# Patient Record
Sex: Female | Born: 1935 | Race: White | Hispanic: No | Smoking: Never smoker
Health system: Southern US, Community
[De-identification: ages and names within clinical notes are randomized; demographics above are authoritative.]

## PROBLEM LIST (undated history)

## (undated) DIAGNOSIS — G43909 Migraine, unspecified, not intractable, without status migrainosus: Secondary | ICD-10-CM

## (undated) DIAGNOSIS — R609 Edema, unspecified: Secondary | ICD-10-CM

## (undated) DIAGNOSIS — E039 Hypothyroidism, unspecified: Secondary | ICD-10-CM

## (undated) DIAGNOSIS — I270 Primary pulmonary hypertension: Secondary | ICD-10-CM

## (undated) DIAGNOSIS — N390 Urinary tract infection, site not specified: Secondary | ICD-10-CM

## (undated) DIAGNOSIS — K5900 Constipation, unspecified: Secondary | ICD-10-CM

## (undated) DIAGNOSIS — R531 Weakness: Secondary | ICD-10-CM

## (undated) DIAGNOSIS — W19XXXA Unspecified fall, initial encounter: Secondary | ICD-10-CM

## (undated) DIAGNOSIS — R42 Dizziness and giddiness: Secondary | ICD-10-CM

## (undated) DIAGNOSIS — F32A Depression, unspecified: Secondary | ICD-10-CM

## (undated) DIAGNOSIS — K219 Gastro-esophageal reflux disease without esophagitis: Secondary | ICD-10-CM

## (undated) DIAGNOSIS — F329 Major depressive disorder, single episode, unspecified: Secondary | ICD-10-CM

## (undated) DIAGNOSIS — E785 Hyperlipidemia, unspecified: Secondary | ICD-10-CM

## (undated) DIAGNOSIS — J329 Chronic sinusitis, unspecified: Secondary | ICD-10-CM

## (undated) DIAGNOSIS — M25519 Pain in unspecified shoulder: Secondary | ICD-10-CM

## (undated) DIAGNOSIS — M1990 Unspecified osteoarthritis, unspecified site: Secondary | ICD-10-CM

## (undated) DIAGNOSIS — J45909 Unspecified asthma, uncomplicated: Secondary | ICD-10-CM

## (undated) DIAGNOSIS — I10 Essential (primary) hypertension: Secondary | ICD-10-CM

## (undated) HISTORY — PX: KNEE SURGERY: SHX244

## (undated) HISTORY — PX: CHOLECYSTECTOMY: SHX55

## (undated) HISTORY — PX: OTHER SURGICAL HISTORY: SHX169

## (undated) HISTORY — PX: ABDOMINAL HYSTERECTOMY: SHX81

---

## 2003-09-02 ENCOUNTER — Inpatient Hospital Stay (HOSPITAL_COMMUNITY): Admission: RE | Admit: 2003-09-02 | Discharge: 2003-09-03 | Payer: Self-pay | Admitting: Otolaryngology

## 2003-09-02 ENCOUNTER — Encounter (INDEPENDENT_AMBULATORY_CARE_PROVIDER_SITE_OTHER): Payer: Self-pay | Admitting: *Deleted

## 2008-10-13 ENCOUNTER — Emergency Department (HOSPITAL_BASED_OUTPATIENT_CLINIC_OR_DEPARTMENT_OTHER): Admission: EM | Admit: 2008-10-13 | Discharge: 2008-10-13 | Payer: Self-pay | Admitting: Emergency Medicine

## 2009-08-06 ENCOUNTER — Emergency Department (HOSPITAL_BASED_OUTPATIENT_CLINIC_OR_DEPARTMENT_OTHER)
Admission: EM | Admit: 2009-08-06 | Discharge: 2009-08-06 | Payer: Self-pay | Source: Home / Self Care | Admitting: Emergency Medicine

## 2010-06-24 NOTE — Op Note (Signed)
NAME:  Jade Rodriguez, Jade Rodriguez                          ACCOUNT NO.:  [id number]   MEDICAL RECORD NO.:  [id number]                   PATIENT TYPE:  INP   LOCATION:  2550                                 FACILITY:  [facility]   PHYSICIAN:  Suzanna Obey, M.D.                    DATE OF BIRTH:  01-Nov-1923   DATE OF PROCEDURE:  09/02/2003  DATE OF DISCHARGE:                                 OPERATIVE REPORT   PREOPERATIVE DIAGNOSIS:  Right tongue mass.   POSTOPERATIVE DIAGNOSIS:  Right tongue mass.   PROCEDURE:  Right hemiglossectomy and direct laryngoscopy.   ANESTHESIA:  General endotracheal tube.   ESTIMATED BLOOD LOSS:  Approximately 20 mL.   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  I do not have those records from another  state.  She now has a mass on the tongue again which was biopsied and showed  severe dysplasia.  The mass is exophytic and approximately 1 cm in size with  extension of irregular tissue beyond this point to about 2 cm.  The patient  was informed of the risks and benefits of the procedure including bleeding,  infection, scar, dysphonia, dysphagia, numbness of the tongue, recurrence,  and risk of anesthetic.  All questions were answered and consent was  obtained.   DESCRIPTION OF PROCEDURE:  The patient was taken to the operating room and  placed in the supine position.  After adequate general endotracheal tube  anesthesia was placed in the Rose position and the direct laryngoscopy scope  was used to examine the oral cavity, oropharynx, hypopharynx.  There were no  lesions identified in the piriform, vallecula, epiglottis, periglottis  looked normal.  The postcricoid cervical esophageal region looked normal.  The palate and tonsils all looked clear.  The tongue base all felt clear.  The lesion was still present on the right side and  exophytic.  The procedure  was then performed by draping in the usual sterile fashion.  An incision was  made with electrocautery around this lesion with a good margin by  visualization.  The tumor was resected with a fairly wide piece of tissue of  about 3 cm and 4 cm measurement.  The frozen section was sent.  The superior  margin had possible carcinoma in situ as the diagnosis, so another section  was removed from the whole superior margin from  anterior to posterior.  This was sent for permanent section.  The lining  still looked by visualization totally normal at that site.  The tongue was  closed after irrigation with interrupted 3-0 Vicryl.  The patient was then  awakened and brought to the recovery room in stable condition.  Needle,  sponge, and instrument counts correct.  Suzanna Obey, M.D.    Cordelia Pen  D:  09/02/2003  T:  09/02/2003  Job:  [id number]   cc:   Brooke Bonito  [address]  [place]  [place] [address]  Fax: [phone]

## 2011-11-16 ENCOUNTER — Emergency Department (HOSPITAL_BASED_OUTPATIENT_CLINIC_OR_DEPARTMENT_OTHER)
Admission: EM | Admit: 2011-11-16 | Discharge: 2011-11-16 | Disposition: A | Discharge status: Home / Self Care | Payer: Medicare Other | Attending: Emergency Medicine | Admitting: Emergency Medicine

## 2011-11-16 ENCOUNTER — Encounter (HOSPITAL_BASED_OUTPATIENT_CLINIC_OR_DEPARTMENT_OTHER): Payer: Self-pay | Admitting: *Deleted

## 2011-11-16 DIAGNOSIS — I10 Essential (primary) hypertension: Secondary | ICD-10-CM | POA: Insufficient documentation

## 2011-11-16 DIAGNOSIS — Z4802 Encounter for removal of sutures: Secondary | ICD-10-CM

## 2011-11-16 DIAGNOSIS — Z888 Allergy status to other drugs, medicaments and biological substances status: Secondary | ICD-10-CM | POA: Insufficient documentation

## 2011-11-16 HISTORY — DX: Essential (primary) hypertension: I10

## 2011-11-16 NOTE — ED Notes (Signed)
Here for suture removal from her face. She fell while in [employer] at an overlook and has an extensive facial laceration that is healing.

## 2011-11-16 NOTE — ED Notes (Signed)
Pt. Was seen by PA C and was discharged

## 2011-11-16 NOTE — ED Provider Notes (Signed)
History     CSN: [id number]  Arrival date & time 11/16/11  1447   First MD Initiated Contact with Patient 11/16/11 1634      Chief Complaint  Patient presents with  . Suture / Staple Removal    (Consider location/radiation/quality/duration/timing/severity/associated sxs/prior treatment) Patient is a 78 y.o. female presenting with suture removal. The history is provided by the patient. No language interpreter was used.  Suture / Staple Removal  The sutures were placed 3 to 6 days ago. Treatments since wound repair include antibiotic ointment use. There has been no drainage from the wound. The redness has worsened.   Pt has sutures in face and in right thumb.   Pt reports plastic surgeon told her to have sutures removed today.   Past Medical History  Diagnosis Date  . Hypertension     Past Surgical History  Procedure Date  . Abdominal hysterectomy   . Cholecystectomy   . Knee relacement     No family history on file.  History  Substance Use Topics  . Smoking status: Never Smoker   . Smokeless tobacco: Not on file  . Alcohol Use: No    OB History    Grav Para Term Preterm Abortions TAB SAB Ect Mult Living                  Review of Systems  Skin: Positive for wound.  All other systems reviewed and are negative.    Allergies  Aspirin and Maxidone  Home Medications   Current Outpatient Rx  Name Route Sig Dispense Refill  . AZOR PO Oral Take by mouth.    Marland Kitchen LEXAPRO PO Oral Take by mouth.    . ADVAIR DISKUS IN Inhalation Inhale into the lungs.    Marland Kitchen NORTRIPTYLINE HCL PO Oral Take by mouth.    Marland Kitchen RANITIDINE HCL PO Oral Take by mouth.    Marland Kitchen MAXALT PO Oral Take by mouth.    . TRIAMTERENE-HCTZ 37.5-25 MG PO TABS Oral Take 1 tablet by mouth daily.      BP 136/68  Pulse 66  Temp 97.3 F (36.3 C) (Oral)  Resp 20  SpO2 99%  Physical Exam  Nursing note and vitals reviewed. Constitutional: She is oriented to person, place, and time. She appears well-developed  and well-nourished.  HENT:  Head: Normocephalic.       Large healing laceration forehead,  3 smaller lacerations  Eyes: Conjunctivae normal are normal. Pupils are equal, round, and reactive to light.  Neck: Normal range of motion. Neck supple.  Musculoskeletal: Normal range of motion.  Neurological: She is alert and oriented to person, place, and time. She has normal reflexes.  Psychiatric: She has a normal mood and affect.    ED Course  Procedures (including critical care time)  Labs Reviewed - No data to display No results found.   No diagnosis found.    MDM  Sutures removed        Lonia Skinner [place], [place] 11/16/11 [id number]

## 2011-11-17 NOTE — ED Provider Notes (Signed)
Medical screening examination/treatment/procedure(s) were performed by non-physician practitioner and as supervising physician I was immediately available for consultation/collaboration.   Babita Amaker, MD 11/17/11 0009 

## 2014-08-17 ENCOUNTER — Encounter (HOSPITAL_BASED_OUTPATIENT_CLINIC_OR_DEPARTMENT_OTHER): Payer: Self-pay

## 2014-08-17 ENCOUNTER — Inpatient Hospital Stay (HOSPITAL_BASED_OUTPATIENT_CLINIC_OR_DEPARTMENT_OTHER)
Admission: EM | Admit: 2014-08-17 | Discharge: 2014-08-21 | DRG: 086 | Disposition: A | Discharge status: Skilled Nursing Facility | Payer: Medicare Other | Attending: Internal Medicine | Admitting: Internal Medicine

## 2014-08-17 ENCOUNTER — Emergency Department (HOSPITAL_BASED_OUTPATIENT_CLINIC_OR_DEPARTMENT_OTHER): Payer: Medicare Other

## 2014-08-17 ENCOUNTER — Inpatient Hospital Stay (HOSPITAL_COMMUNITY): Payer: Medicare Other

## 2014-08-17 DIAGNOSIS — K219 Gastro-esophageal reflux disease without esophagitis: Secondary | ICD-10-CM | POA: Diagnosis present

## 2014-08-17 DIAGNOSIS — R4182 Altered mental status, unspecified: Secondary | ICD-10-CM

## 2014-08-17 DIAGNOSIS — S065X0A Traumatic subdural hemorrhage without loss of consciousness, initial encounter: Principal | ICD-10-CM | POA: Diagnosis present

## 2014-08-17 DIAGNOSIS — R296 Repeated falls: Secondary | ICD-10-CM

## 2014-08-17 DIAGNOSIS — I270 Primary pulmonary hypertension: Secondary | ICD-10-CM | POA: Diagnosis not present

## 2014-08-17 DIAGNOSIS — R0902 Hypoxemia: Secondary | ICD-10-CM

## 2014-08-17 DIAGNOSIS — Z888 Allergy status to other drugs, medicaments and biological substances status: Secondary | ICD-10-CM

## 2014-08-17 DIAGNOSIS — M1990 Unspecified osteoarthritis, unspecified site: Secondary | ICD-10-CM | POA: Diagnosis present

## 2014-08-17 DIAGNOSIS — S065XAA Traumatic subdural hemorrhage with loss of consciousness status unknown, initial encounter: Secondary | ICD-10-CM | POA: Diagnosis present

## 2014-08-17 DIAGNOSIS — G43909 Migraine, unspecified, not intractable, without status migrainosus: Secondary | ICD-10-CM | POA: Diagnosis present

## 2014-08-17 DIAGNOSIS — I509 Heart failure, unspecified: Secondary | ICD-10-CM | POA: Diagnosis present

## 2014-08-17 DIAGNOSIS — G4700 Insomnia, unspecified: Secondary | ICD-10-CM | POA: Diagnosis present

## 2014-08-17 DIAGNOSIS — E039 Hypothyroidism, unspecified: Secondary | ICD-10-CM | POA: Diagnosis present

## 2014-08-17 DIAGNOSIS — Z9181 History of falling: Secondary | ICD-10-CM | POA: Diagnosis not present

## 2014-08-17 DIAGNOSIS — F329 Major depressive disorder, single episode, unspecified: Secondary | ICD-10-CM | POA: Diagnosis present

## 2014-08-17 DIAGNOSIS — R40241 Glasgow coma scale score 13-15: Secondary | ICD-10-CM | POA: Diagnosis not present

## 2014-08-17 DIAGNOSIS — E785 Hyperlipidemia, unspecified: Secondary | ICD-10-CM | POA: Diagnosis present

## 2014-08-17 DIAGNOSIS — Z79899 Other long term (current) drug therapy: Secondary | ICD-10-CM | POA: Diagnosis not present

## 2014-08-17 DIAGNOSIS — E7220 Disorder of urea cycle metabolism, unspecified: Secondary | ICD-10-CM

## 2014-08-17 DIAGNOSIS — I6200 Nontraumatic subdural hemorrhage, unspecified: Secondary | ICD-10-CM | POA: Diagnosis not present

## 2014-08-17 DIAGNOSIS — Z9049 Acquired absence of other specified parts of digestive tract: Secondary | ICD-10-CM | POA: Diagnosis present

## 2014-08-17 DIAGNOSIS — N179 Acute kidney failure, unspecified: Secondary | ICD-10-CM | POA: Diagnosis present

## 2014-08-17 DIAGNOSIS — S0101XA Laceration without foreign body of scalp, initial encounter: Secondary | ICD-10-CM | POA: Diagnosis present

## 2014-08-17 DIAGNOSIS — J45909 Unspecified asthma, uncomplicated: Secondary | ICD-10-CM | POA: Diagnosis present

## 2014-08-17 DIAGNOSIS — Z90710 Acquired absence of both cervix and uterus: Secondary | ICD-10-CM

## 2014-08-17 DIAGNOSIS — Z885 Allergy status to narcotic agent status: Secondary | ICD-10-CM

## 2014-08-17 DIAGNOSIS — I10 Essential (primary) hypertension: Secondary | ICD-10-CM | POA: Diagnosis present

## 2014-08-17 DIAGNOSIS — R739 Hyperglycemia, unspecified: Secondary | ICD-10-CM | POA: Diagnosis present

## 2014-08-17 DIAGNOSIS — Z96659 Presence of unspecified artificial knee joint: Secondary | ICD-10-CM | POA: Diagnosis present

## 2014-08-17 DIAGNOSIS — K760 Fatty (change of) liver, not elsewhere classified: Secondary | ICD-10-CM | POA: Diagnosis present

## 2014-08-17 DIAGNOSIS — J329 Chronic sinusitis, unspecified: Secondary | ICD-10-CM | POA: Diagnosis present

## 2014-08-17 DIAGNOSIS — R404 Transient alteration of awareness: Secondary | ICD-10-CM

## 2014-08-17 DIAGNOSIS — Z881 Allergy status to other antibiotic agents status: Secondary | ICD-10-CM

## 2014-08-17 DIAGNOSIS — W19XXXA Unspecified fall, initial encounter: Secondary | ICD-10-CM | POA: Diagnosis present

## 2014-08-17 DIAGNOSIS — S065X9A Traumatic subdural hemorrhage with loss of consciousness of unspecified duration, initial encounter: Secondary | ICD-10-CM | POA: Diagnosis present

## 2014-08-17 HISTORY — DX: Constipation, unspecified: K59.00

## 2014-08-17 HISTORY — DX: Edema, unspecified: R60.9

## 2014-08-17 HISTORY — DX: Depression, unspecified: F32.A

## 2014-08-17 HISTORY — DX: Major depressive disorder, single episode, unspecified: F32.9

## 2014-08-17 HISTORY — DX: Dizziness and giddiness: R42

## 2014-08-17 HISTORY — DX: Hypothyroidism, unspecified: E03.9

## 2014-08-17 HISTORY — DX: Pain in unspecified shoulder: M25.519

## 2014-08-17 HISTORY — DX: Migraine, unspecified, not intractable, without status migrainosus: G43.909

## 2014-08-17 HISTORY — DX: Chronic sinusitis, unspecified: J32.9

## 2014-08-17 HISTORY — DX: Unspecified fall, initial encounter: W19.XXXA

## 2014-08-17 HISTORY — DX: Hyperlipidemia, unspecified: E78.5

## 2014-08-17 HISTORY — DX: Weakness: R53.1

## 2014-08-17 HISTORY — DX: Primary pulmonary hypertension: I27.0

## 2014-08-17 HISTORY — DX: Unspecified asthma, uncomplicated: J45.909

## 2014-08-17 HISTORY — DX: Unspecified osteoarthritis, unspecified site: M19.90

## 2014-08-17 HISTORY — DX: Urinary tract infection, site not specified: N39.0

## 2014-08-17 HISTORY — DX: Gastro-esophageal reflux disease without esophagitis: K21.9

## 2014-08-17 LAB — BASIC METABOLIC PANEL
ANION GAP: 11 (ref 5–15)
BUN: 34 mg/dL — AB (ref 6–20)
CALCIUM: 9.4 mg/dL (ref 8.9–10.3)
CO2: 28 mmol/L (ref 22–32)
Chloride: 94 mmol/L — ABNORMAL LOW (ref 101–111)
Creatinine, Ser: 1.46 mg/dL — ABNORMAL HIGH (ref 0.44–1.00)
GFR calc Af Amer: 36 mL/min — ABNORMAL LOW (ref >60)
GFR calc non Af Amer: 31 mL/min — ABNORMAL LOW (ref >60)
Glucose, Bld: 146 mg/dL — ABNORMAL HIGH (ref 65–99)
Potassium: 4.4 mmol/L (ref 3.5–5.1)
SODIUM: 133 mmol/L — AB (ref 135–145)

## 2014-08-17 LAB — CBC WITH DIFFERENTIAL/PLATELET
BASOS ABS: 0.1 10*3/uL (ref 0.0–0.1)
BASOS ABS: 0 10*3/uL (ref 0.0–0.1)
BASOS PCT: 0 % (ref 0–1)
Basophils Relative: 1 % (ref 0–1)
EOS ABS: 0.1 10*3/uL (ref 0.0–0.7)
EOS ABS: 0.1 10*3/uL (ref 0.0–0.7)
Eosinophils Relative: 1 % (ref 0–5)
Eosinophils Relative: 1 % (ref 0–5)
HCT: 38.2 % (ref 36.0–46.0)
HCT: 36.9 % (ref 36.0–46.0)
Hemoglobin: 12.3 g/dL (ref 12.0–15.0)
Hemoglobin: 12 g/dL (ref 12.0–15.0)
LYMPHS ABS: 1.2 10*3/uL (ref 0.7–4.0)
LYMPHS PCT: 16 % (ref 12–46)
Lymphocytes Relative: 14 % (ref 12–46)
Lymphs Abs: 1.3 10*3/uL (ref 0.7–4.0)
MCH: 27.3 pg (ref 26.0–34.0)
MCH: 27.1 pg (ref 26.0–34.0)
MCHC: 32.2 g/dL (ref 30.0–36.0)
MCHC: 32.5 g/dL (ref 30.0–36.0)
MCV: 84.9 fL (ref 78.0–100.0)
MCV: 83.3 fL (ref 78.0–100.0)
MONO ABS: 0.9 10*3/uL (ref 0.1–1.0)
MONOS PCT: 9 % (ref 3–12)
MONOS PCT: 12 % (ref 3–12)
Monocytes Absolute: 0.9 10*3/uL (ref 0.1–1.0)
NEUTROS ABS: 5.5 10*3/uL (ref 1.7–7.7)
NEUTROS PCT: 75 % (ref 43–77)
Neutro Abs: 7 10*3/uL (ref 1.7–7.7)
Neutrophils Relative %: 71 % (ref 43–77)
Platelets: 223 10*3/uL (ref 150–400)
Platelets: 221 10*3/uL (ref 150–400)
RBC: 4.5 MIL/uL (ref 3.87–5.11)
RBC: 4.43 MIL/uL (ref 3.87–5.11)
RDW: 15.2 % (ref 11.5–15.5)
RDW: 15.2 % (ref 11.5–15.5)
WBC: 9.2 10*3/uL (ref 4.0–10.5)
WBC: 7.8 10*3/uL (ref 4.0–10.5)

## 2014-08-17 LAB — COMPREHENSIVE METABOLIC PANEL
ALT: 21 U/L (ref 14–54)
AST: 25 U/L (ref 15–41)
Albumin: 3.6 g/dL (ref 3.5–5.0)
Alkaline Phosphatase: 89 U/L (ref 38–126)
Anion gap: 8 (ref 5–15)
BILIRUBIN TOTAL: 0.3 mg/dL (ref 0.3–1.2)
BUN: 28 mg/dL — ABNORMAL HIGH (ref 6–20)
CO2: 29 mmol/L (ref 22–32)
Calcium: 9.2 mg/dL (ref 8.9–10.3)
Chloride: 95 mmol/L — ABNORMAL LOW (ref 101–111)
Creatinine, Ser: 1.24 mg/dL — ABNORMAL HIGH (ref 0.44–1.00)
GFR calc Af Amer: 44 mL/min — ABNORMAL LOW (ref >60)
GFR calc non Af Amer: 38 mL/min — ABNORMAL LOW (ref >60)
Glucose, Bld: 91 mg/dL (ref 65–99)
POTASSIUM: 4.5 mmol/L (ref 3.5–5.1)
SODIUM: 132 mmol/L — AB (ref 135–145)
Total Protein: 6.4 g/dL — ABNORMAL LOW (ref 6.5–8.1)

## 2014-08-17 LAB — PROTIME-INR
INR: 0.98 (ref 0.00–1.49)
INR: 1.08 (ref 0.00–1.49)
Prothrombin Time: 13.2 seconds (ref 11.6–15.2)
Prothrombin Time: 14.2 seconds (ref 11.6–15.2)

## 2014-08-17 LAB — MRSA PCR SCREENING: MRSA by PCR: NEGATIVE

## 2014-08-17 LAB — TSH: TSH: 2.716 u[IU]/mL (ref 0.350–4.500)

## 2014-08-17 LAB — MAGNESIUM: Magnesium: 1.8 mg/dL (ref 1.7–2.4)

## 2014-08-17 LAB — APTT: aPTT: 31 seconds (ref 24–37)

## 2014-08-17 LAB — PHOSPHORUS: Phosphorus: 3.6 mg/dL (ref 2.5–4.6)

## 2014-08-17 LAB — T4, FREE: Free T4: 0.96 ng/dL (ref 0.61–1.12)

## 2014-08-17 MED ORDER — AMLODIPINE BESYLATE 5 MG PO TABS
5.0000 mg | ORAL_TABLET | Freq: Every day | ORAL | Status: DC
  Administered 2014-08-17 – 2014-08-21 (×5): 5 mg via ORAL
  Filled 2014-08-17 (×5): qty 1

## 2014-08-17 MED ORDER — LIDOCAINE-EPINEPHRINE (PF) 2 %-1:200000 IJ SOLN
5.0000 mL | Freq: Once | INTRAMUSCULAR | Status: AC
  Administered 2014-08-17: 5 mL
  Filled 2014-08-17: qty 10

## 2014-08-17 MED ORDER — MOMETASONE FURO-FORMOTEROL FUM 100-5 MCG/ACT IN AERO
2.0000 | INHALATION_SPRAY | Freq: Two times a day (BID) | RESPIRATORY_TRACT | Status: DC
  Administered 2014-08-17 – 2014-08-20 (×5): 2 via RESPIRATORY_TRACT
  Filled 2014-08-17 (×2): qty 8.8

## 2014-08-17 MED ORDER — PANTOPRAZOLE SODIUM 40 MG PO TBEC
40.0000 mg | DELAYED_RELEASE_TABLET | Freq: Every day | ORAL | Status: DC
  Administered 2014-08-18: 40 mg via ORAL
  Filled 2014-08-17: qty 1

## 2014-08-17 MED ORDER — GABAPENTIN 100 MG PO CAPS
100.0000 mg | ORAL_CAPSULE | Freq: Two times a day (BID) | ORAL | Status: DC
  Administered 2014-08-17 – 2014-08-19 (×4): 100 mg via ORAL
  Filled 2014-08-17 (×7): qty 1

## 2014-08-17 MED ORDER — LEVOTHYROXINE SODIUM 25 MCG PO TABS
25.0000 ug | ORAL_TABLET | Freq: Every day | ORAL | Status: DC
  Administered 2014-08-18 – 2014-08-21 (×4): 25 ug via ORAL
  Filled 2014-08-17 (×5): qty 1

## 2014-08-17 MED ORDER — ACETAMINOPHEN 500 MG PO TABS
500.0000 mg | ORAL_TABLET | Freq: Four times a day (QID) | ORAL | Status: DC | PRN
  Administered 2014-08-17 – 2014-08-18 (×2): 500 mg via ORAL
  Filled 2014-08-17 (×2): qty 1

## 2014-08-17 MED ORDER — HYDRALAZINE HCL 20 MG/ML IJ SOLN: 10.0000 mg | INTRAMUSCULAR | Status: DC | PRN

## 2014-08-17 NOTE — ED Provider Notes (Signed)
CSN: [id number]     Arrival date & time 08/17/14  1322 History   First MD Initiated Contact with Patient 08/17/14 1343     Chief Complaint  Patient presents with  . Fall     (Consider location/radiation/quality/duration/timing/severity/associated sxs/prior Treatment) HPI The patient has a history of migraine headaches. She reports that she had a headache earlier in the day and had taken Maxalt. Subsequently she went to lunch and while trying to transfer from a chair to her scooter, she fell and struck the back of her head. She did get a laceration to the scalp. There was no loss of consciousness. The patient reports her headache is more mild now than it was when she had her migraine. She denies any cervical pain. There is no pain to the remainder of the body and no extremity dysfunction different than what she identifies as usual. She does not have a history of taking blood thinner medications. He has not developed nausea, vomiting or mental status change. Past Medical History  Diagnosis Date  . Hypertension   . Falls   . Weakness   . Asthma   . Depression   . Primary pulmonary HTN   . Hypothyroidism   . Edema   . Dizziness   . Dizziness and giddiness   . Migraine   . Sinusitis, chronic   . GERD (gastroesophageal reflux disease)   . Osteoarthritis   . Constipation   . Hyperlipemia   . UTI (lower urinary tract infection)   . Shoulder pain    Past Surgical History  Procedure Laterality Date  . Abdominal hysterectomy    . Cholecystectomy    . Knee relacement    . Knee surgery     No family history on file. History  Substance Use Topics  . Smoking status: Never Smoker   . Smokeless tobacco: Not on file  . Alcohol Use: No   OB History    No data available     Review of Systems 10 Systems reviewed and are negative for acute change except as noted in the HPI.    Allergies  Aspirin; Codeine; Erythromycin base; Hydrocodone; Macrobid; Maxidone; and  Nitrofurantoin  Home Medications   Prior to Admission medications   Medication Sig Start Date End Date Taking? Authorizing Provider  acetaminophen (TYLENOL ARTHRITIS PAIN) 650 MG CR tablet Take 650 mg by mouth every 8 (eight) hours as needed for pain.   Yes Historical Provider, MD  Calcium-Vitamin D-Vitamin K (VIACTIV PO) Take by mouth.   Yes Historical Provider, MD  Calcium-Vitamin D-Vitamin K 500-500-40 MG-UNT-MCG CHEW Chew by mouth.   Yes Historical Provider, MD  Docusate Sodium (COLACE PO) Take by mouth.   Yes Historical Provider, MD  furosemide (LASIX) 20 MG tablet Take 20 mg by mouth.   Yes Historical Provider, MD  GABAPENTIN PO Take by mouth.   Yes Historical Provider, MD  Levothyroxine Sodium (SYNTHROID PO) Take by mouth.   Yes Historical Provider, MD  Loratadine (CLARITIN PO) Take by mouth.   Yes Historical Provider, MD  MECLIZINE HCL PO Take by mouth.   Yes Historical Provider, MD  metaxalone (SKELAXIN) 800 MG tablet Take 800 mg by mouth 3 (three) times daily.   Yes Historical Provider, MD  mometasone (NASONEX) 50 MCG/ACT nasal spray Place 2 sprays into the nose daily.   Yes Historical Provider, MD  Multiple Vitamins-Minerals (CENTRUM SILVER PO) Take by mouth.   Yes Historical Provider, MD  NADOLOL PO Take by mouth.   Yes  Historical Provider, MD  OMEPRAZOLE PO Take by mouth.   Yes Historical Provider, MD  prochlorperazine (COMPAZINE) 5 MG tablet Take 5 mg by mouth every 6 (six) hours as needed for nausea or vomiting.   Yes Historical Provider, MD  traMADol (ULTRAM) 50 MG tablet Take by mouth every 6 (six) hours as needed.   Yes Historical Provider, MD  Amlodipine-Olmesartan (AZOR PO) Take by mouth.    Historical Provider, MD  Escitalopram Oxalate (LEXAPRO PO) Take by mouth.    Historical Provider, MD  Fluticasone-Salmeterol (ADVAIR DISKUS IN) Inhale into the lungs.    Historical Provider, MD  NORTRIPTYLINE HCL PO Take by mouth.    Historical Provider, MD  RANITIDINE HCL PO Take by  mouth.    Historical Provider, MD  Rizatriptan Benzoate (MAXALT PO) Take by mouth.    Historical Provider, MD  triamterene-hydrochlorothiazide (MAXZIDE-25) 37.5-25 MG per tablet Take 1 tablet by mouth daily.    Historical Provider, MD   BP 121/60 mmHg  Pulse 58  Temp(Src) 97.6 F (36.4 C) (Oral)  Resp 16  Ht 5\' 4"  (1.626 m)  Wt 150 lb (68.04 kg)  BMI 25.73 kg/m2  SpO2 98% Physical Exam  Constitutional: She is oriented to person, place, and time. She appears well-developed and well-nourished. No distress.  HENT:  Right Ear: External ear normal.  Left Ear: External ear normal.  Nose: Nose normal.  Mouth/Throat: Oropharynx is clear and moist.  2 cm through and through laceration to the posterior left scalp. No active bleeding at this time. No associated hematoma.  Eyes: EOM are normal. Pupils are equal, round, and reactive to light.  Neck: Neck supple.  No C-spine tenderness to palpation  Cardiovascular: Normal rate, regular rhythm, normal heart sounds and intact distal pulses.   Pulmonary/Chest: Effort normal and breath sounds normal.  Chest wall nontender to compression.  Abdominal: Soft. Bowel sounds are normal. She exhibits no distension. There is no tenderness.  Musculoskeletal: Normal range of motion. She exhibits no edema.  No extremity injuries or deformities. The patient is able to go through full range of motion without difficulty. She does have older bruising to the left lower leg from prior fall.  Neurological: She is alert and oriented to person, place, and time. She has normal strength. No cranial nerve deficit. She exhibits normal muscle tone. Coordination normal. GCS eye subscore is 4. GCS verbal subscore is 5. GCS motor subscore is 6.  Skin: Skin is warm, dry and intact.  Psychiatric: She has a normal mood and affect.    ED Course  Procedures (including critical care time) see procedure note which will be cosigned for Resident Dr. Alfonse Flavors. Labs Review Labs  Reviewed  CBC WITH DIFFERENTIAL/PLATELET  BASIC METABOLIC PANEL  PROTIME-INR    Imaging Review Ct Head Wo Contrast  08/17/2014   CLINICAL DATA:  Status post fall. Small laceration to the left occipital area.  EXAM: CT HEAD WITHOUT CONTRAST  TECHNIQUE: Contiguous axial images were obtained from the base of the skull through the vertex without intravenous contrast.  COMPARISON:  07/17/2014  FINDINGS: There is no evidence of mass effect or midline shift. There is no evidence of a space-occupying lesion. There is hyperdense fluid along the right cerebral convexity measuring 6 mm in thickness consistent with a subdural hemorrhage. There is 3 mm of right to left midline shift. There is no evidence of a cortical-based area of acute infarction. There is generalized cerebral atrophy. There is periventricular white matter low attenuation likely secondary  to microangiopathy.  The ventricles and sulci are appropriate for the patient's age. The basal cisterns are patent.  Visualized portions of the orbits are unremarkable. The visualized portions of the paranasal sinuses and mastoid air cells are unremarkable. Cerebrovascular atherosclerotic calcifications are noted.  The osseous structures are unremarkable.  IMPRESSION: 1. Acute right subdural hematoma measuring 6 mm with 3 mm of right-to-left midline shift. Critical Value/emergent results were called by telephone at the time of interpretation on 08/17/2014 at 3:03 pm to Dr. Arby Barrette , who verbally acknowledged these results.   Electronically Signed   By: Elige Ko   On: 08/17/2014 15:09     EKG Interpretation None     Consult: 1528 Dr. Lovell Sheehan advises for admission to ICU for observation and repeat CT head tomorrow. He was seen the patient in consult.  CRITICAL CARE Performed by: Arby Barrette   Total critical care time: 30  Critical care time was exclusive of separately billable procedures and treating other patients.  Critical care was  necessary to treat or prevent imminent or life-threatening deterioration.  Critical care was time spent personally by me on the following activities: development of treatment plan with patient and/or surrogate as well as nursing, discussions with consultants, evaluation of patient's response to treatment, examination of patient, obtaining history from patient or surrogate, ordering and performing treatments and interventions, ordering and review of laboratory studies, ordering and review of radiographic studies, pulse oximetry and re-evaluation of patient's condition. MDM   Final diagnoses:  Subdural hematoma   Patient presented as outlined with subdural hematoma from fall earlier today. At this time the patient does have clear mental status. She does not have restaurant stress. She will be admitted for consultation with Dr. neurosurgery for observation in the ICU. Laceration was repaired by resident assistant Dr. Devota Pace.    Arby Barrette, MD 08/17/14 1540

## 2014-08-17 NOTE — ED Notes (Signed)
Phone Handoff report given to [facility] Transport Team 

## 2014-08-17 NOTE — ED Notes (Signed)
Placed back on cont cardiac monitoring 

## 2014-08-17 NOTE — ED Notes (Signed)
Family at bedside. 

## 2014-08-17 NOTE — ED Notes (Addendum)
Family in room with pt

## 2014-08-17 NOTE — ED Notes (Signed)
Pt placed on cont cardiac monitoring.

## 2014-08-17 NOTE — ED Notes (Signed)
Pt c/o HA, understands will provide pain med after CT results have been posted

## 2014-08-17 NOTE — ED Notes (Signed)
Pt states fell today and hit head, family states pt fell this past Saturday also hitting head.

## 2014-08-17 NOTE — ED Notes (Signed)
Patient transported to CT, via stretcher, sr x 2 up, CT staff aware of pt high fall risk

## 2014-08-17 NOTE — ED Notes (Signed)
Small laceration, no active bleeding noted at this time at Left occipital area.

## 2014-08-17 NOTE — Consult Note (Signed)
Reason for Consult: Small right acute subdural hematoma Referring Physician: Dr. Fanny Bien is an 81 y.o. female.  HPI: The patient is an 81 year old white female who fell today striking her head. She suffered a small laceration of her scalp. She was taken to the ER where a CAT scan demonstrated a small right subdural hematoma. Her scalp was sutured. She was transferred to [facility] for further observation.  Presently the patient is alert and pleasant. She admits to a mild headache. She denies neck pain, numbness, tingling, seizures, etc. She does not take any blood thinners.  Past Medical History  Diagnosis Date  . Hypertension   . Falls   . Weakness   . Asthma   . Depression   . Primary pulmonary HTN   . Hypothyroidism   . Edema   . Dizziness   . Dizziness and giddiness   . Migraine   . Sinusitis, chronic   . GERD (gastroesophageal reflux disease)   . Osteoarthritis   . Constipation   . Hyperlipemia   . UTI (lower urinary tract infection)   . Shoulder pain     Past Surgical History  Procedure Laterality Date  . Abdominal hysterectomy    . Cholecystectomy    . Knee relacement    . Knee surgery      No family history on file.  Social History:  reports that she has never smoked. She does not have any smokeless tobacco history on file. She reports that she does not drink alcohol or use illicit drugs.  Allergies:  Allergies  Allergen Reactions  . Aspirin Other (See Comments)    Hx of bleeding ulcer  . Codeine Rash  . Erythromycin Base Rash  . Hydrocodone Rash  . Macrobid [Nitrofurantoin Monohyd Macro] Rash  . Maxidone [Hydrocodone-Acetaminophen] Rash    rash  . Nitrofurantoin Rash    Medications:  I have reviewed the patient's current medications. Prior to Admission:  Prescriptions prior to admission  Medication Sig Dispense Refill Last Dose  . acetaminophen (TYLENOL ARTHRITIS PAIN) 650 MG CR tablet Take 1,300 mg by mouth See admin  instructions. Take 2 caplets (1300 mg) every morning, may also take  2 caplets (1300 mg) every 8 hours as needed for pain   08/17/2014 at Unknown time  . acetaminophen (TYLENOL) 500 MG tablet Take 500 mg by mouth every 12 (twelve) hours as needed (pain).   08/15/2014  . amLODipine-olmesartan (AZOR) 5-40 MG per tablet Take 1 tablet by mouth at bedtime.   08/16/2014 at Unknown time  . Calcium-Vitamin D-Vitamin K (VIACTIV) 341-937-90 MG-UNT-MCG CHEW Chew 1 tablet by mouth daily.   08/17/2014 at Unknown time  . Cranberry-Vitamin C-Vitamin E (CRANBERRY PLUS VITAMIN C) 4200-20-3 MG-MG-UNIT CAPS Take 1 capsule by mouth daily.   08/17/2014 at Unknown time  . docusate sodium (COLACE) 100 MG capsule Take 100 mg by mouth daily as needed for mild constipation.   >week ago  . escitalopram (LEXAPRO) 5 MG tablet Take 5 mg by mouth daily.    08/17/2014 at Unknown time  . fluconazole (DIFLUCAN) 100 MG tablet Take 100 mg by mouth daily. 13 day course started 08/06/14   08/17/2014 at Unknown time  . Fluticasone-Salmeterol (ADVAIR) 250-50 MCG/DOSE AEPB Inhale 1 puff into the lungs 2 (two) times daily.   08/17/2014 at am  . furosemide (LASIX) 20 MG tablet Take 20 mg by mouth every Monday, Wednesday, and Friday.    08/17/2014  . gabapentin (NEURONTIN) 300 MG capsule  Take 900 mg by mouth at bedtime.    08/16/2014 at Unknown time  . levothyroxine (SYNTHROID, LEVOTHROID) 25 MCG tablet Take 25 mcg by mouth daily before breakfast. 6:45am   08/17/2014 at Unknown time  . loratadine (CLARITIN) 10 MG tablet Take 10 mg by mouth daily as needed for allergies.   >week ago  . meclizine (ANTIVERT) 25 MG tablet Take by mouth 2 (two) times daily as needed for dizziness (giddiness).    >week ago  . metaxalone (SKELAXIN) 800 MG tablet Take 400-800 mg by mouth See admin instructions. Take 1/2 - 1 tablet (400-800 mg) at onset of headache, may repeat in 6-8 hours if needed   08/17/2014 at Unknown time  . mometasone (NASONEX) 50 MCG/ACT nasal spray Place 2  sprays into both nostrils daily.    08/17/2014 at Unknown time  . Multiple Vitamin (MULTIVITAMIN WITH MINERALS) TABS tablet Take 1 tablet by mouth daily. Centrum Silver Ultra   08/17/2014 at Unknown time  . nadolol (CORGARD) 20 MG tablet Take 20 mg by mouth daily.    08/17/2014 at 848  . nystatin (MYCOSTATIN) powder Apply topically 4 (four) times daily.    08/16/2014  . omeprazole (PRILOSEC) 40 MG capsule Take 40 mg by mouth daily before lunch.    08/17/2014 at Unknown time  . prochlorperazine (COMPAZINE) 5 MG tablet Take 5 mg by mouth every 6 (six) hours as needed for nausea (headache).    >week ago  . ranitidine (ZANTAC) 300 MG tablet Take 300 mg by mouth at bedtime.    08/16/2014 at Unknown time  . rizatriptan (MAXALT) 10 MG tablet Take 10 mg by mouth daily as needed for migraine. May repeat in 2 hours if needed, max 3 tablets in 24 hours   08/17/2014 at Unknown time  . traMADol (ULTRAM) 50 MG tablet Take 50 mg by mouth See admin instructions. Take 1 tablet (50 mg) daily; may take an additional tablet 12 hours later as needed for pain   08/17/2014 at am  . triamterene-hydrochlorothiazide (MAXZIDE-25) 37.5-25 MG per tablet Take 1 tablet by mouth daily.   08/17/2014 at Unknown time   Scheduled: . amLODipine  5 mg Oral Daily  . gabapentin  100 mg Oral BID  . [START ON 08/18/2014] levothyroxine  25 mcg Oral QAC breakfast  . mometasone-formoterol  2 puff Inhalation BID  . [START ON 08/18/2014] pantoprazole  40 mg Oral Q1200   Continuous:  [id number], hydrALAZINE Anti-infectives    None       Results for orders placed or performed during the hospital encounter of 08/17/14 (from the past 48 hour(s))  Basic metabolic panel     Status: Abnormal   Collection Time: 08/17/14  3:08 PM  Result Value Ref Range   Sodium 133 (L) 135 - 145 mmol/L   Potassium 4.4 3.5 - 5.1 mmol/L   Chloride 94 (L) 101 - 111 mmol/L   CO2 28 22 - 32 mmol/L   Glucose, Bld 146 (H) 65 - 99 mg/dL   BUN 34 (H) 6 - 20 mg/dL    Creatinine, Ser 1.46 (H) 0.44 - 1.00 mg/dL   Calcium 9.4 8.9 - 10.3 mg/dL   GFR calc non Af Amer 31 (L) >60 mL/min   GFR calc Af Amer 36 (L) >60 mL/min    Comment: (NOTE) The eGFR has been calculated using the CKD EPI equation. This calculation has not been validated in all clinical situations. eGFR's persistently <60 mL/min signify possible Chronic Kidney Disease.  Anion gap 11 5 - 15  CBC with Differential     Status: None   Collection Time: 08/17/14  3:08 PM  Result Value Ref Range   WBC 9.2 4.0 - 10.5 K/uL   RBC 4.50 3.87 - 5.11 MIL/uL   Hemoglobin 12.3 12.0 - 15.0 g/dL   HCT 38.2 36.0 - 46.0 %   MCV 84.9 78.0 - 100.0 fL   MCH 27.3 26.0 - 34.0 pg   MCHC 32.2 30.0 - 36.0 g/dL   RDW 15.2 11.5 - 15.5 %   Platelets 223 150 - 400 K/uL   Neutrophils Relative % 75 43 - 77 %   Neutro Abs 7.0 1.7 - 7.7 K/uL   Lymphocytes Relative 14 12 - 46 %   Lymphs Abs 1.3 0.7 - 4.0 K/uL   Monocytes Relative 9 3 - 12 %   Monocytes Absolute 0.9 0.1 - 1.0 K/uL   Eosinophils Relative 1 0 - 5 %   Eosinophils Absolute 0.1 0.0 - 0.7 K/uL   Basophils Relative 1 0 - 1 %   Basophils Absolute 0.1 0.0 - 0.1 K/uL  Protime-INR     Status: None   Collection Time: 08/17/14  3:08 PM  Result Value Ref Range   Prothrombin Time 13.2 11.6 - 15.2 seconds   INR 0.98 0.00 - 1.49  CBC with Differential/Platelet     Status: None   Collection Time: 08/17/14  6:25 PM  Result Value Ref Range   WBC 7.8 4.0 - 10.5 K/uL   RBC 4.43 3.87 - 5.11 MIL/uL   Hemoglobin 12.0 12.0 - 15.0 g/dL   HCT 36.9 36.0 - 46.0 %   MCV 83.3 78.0 - 100.0 fL   MCH 27.1 26.0 - 34.0 pg   MCHC 32.5 30.0 - 36.0 g/dL   RDW 15.2 11.5 - 15.5 %   Platelets 221 150 - 400 K/uL   Neutrophils Relative % 71 43 - 77 %   Neutro Abs 5.5 1.7 - 7.7 K/uL   Lymphocytes Relative 16 12 - 46 %   Lymphs Abs 1.2 0.7 - 4.0 K/uL   Monocytes Relative 12 3 - 12 %   Monocytes Absolute 0.9 0.1 - 1.0 K/uL   Eosinophils Relative 1 0 - 5 %   Eosinophils  Absolute 0.1 0.0 - 0.7 K/uL   Basophils Relative 0 0 - 1 %   Basophils Absolute 0.0 0.0 - 0.1 K/uL  APTT     Status: None   Collection Time: 08/17/14  6:25 PM  Result Value Ref Range   aPTT 31 24 - 37 seconds  Comprehensive metabolic panel     Status: Abnormal   Collection Time: 08/17/14  6:25 PM  Result Value Ref Range   Sodium 132 (L) 135 - 145 mmol/L   Potassium 4.5 3.5 - 5.1 mmol/L   Chloride 95 (L) 101 - 111 mmol/L   CO2 29 22 - 32 mmol/L   Glucose, Bld 91 65 - 99 mg/dL   BUN 28 (H) 6 - 20 mg/dL   Creatinine, Ser 1.24 (H) 0.44 - 1.00 mg/dL   Calcium 9.2 8.9 - 10.3 mg/dL   Total Protein 6.4 (L) 6.5 - 8.1 g/dL   Albumin 3.6 3.5 - 5.0 g/dL   AST 25 15 - 41 U/L   ALT 21 14 - 54 U/L   Alkaline Phosphatase 89 38 - 126 U/L   Total Bilirubin 0.3 0.3 - 1.2 mg/dL   GFR calc non Af Amer 38 (L) >60  mL/min   GFR calc Af Amer 44 (L) >60 mL/min    Comment: (NOTE) The eGFR has been calculated using the CKD EPI equation. This calculation has not been validated in all clinical situations. eGFR's persistently <60 mL/min signify possible Chronic Kidney Disease.    Anion gap 8 5 - 15  Protime-INR     Status: None   Collection Time: 08/17/14  6:25 PM  Result Value Ref Range   Prothrombin Time 14.2 11.6 - 15.2 seconds   INR 1.08 0.00 - 1.49  Magnesium     Status: None   Collection Time: 08/17/14  6:25 PM  Result Value Ref Range   Magnesium 1.8 1.7 - 2.4 mg/dL  Phosphorus     Status: None   Collection Time: 08/17/14  6:25 PM  Result Value Ref Range   Phosphorus 3.6 2.5 - 4.6 mg/dL  TSH     Status: None   Collection Time: 08/17/14  6:25 PM  Result Value Ref Range   TSH 2.716 0.350 - 4.500 uIU/mL  T4, free     Status: None   Collection Time: 08/17/14  6:25 PM  Result Value Ref Range   Free T4 0.96 0.61 - 1.12 ng/dL    Ct Head Wo Contrast  08/17/2014   CLINICAL DATA:  Status post fall. Small laceration to the left occipital area.  EXAM: CT HEAD WITHOUT CONTRAST  TECHNIQUE:  Contiguous axial images were obtained from the base of the skull through the vertex without intravenous contrast.  COMPARISON:  07/17/2014  FINDINGS: There is no evidence of mass effect or midline shift. There is no evidence of a space-occupying lesion. There is hyperdense fluid along the right cerebral convexity measuring 6 mm in thickness consistent with a subdural hemorrhage. There is 3 mm of right to left midline shift. There is no evidence of a cortical-based area of acute infarction. There is generalized cerebral atrophy. There is periventricular white matter low attenuation likely secondary to microangiopathy.  The ventricles and sulci are appropriate for the patient's age. The basal cisterns are patent.  Visualized portions of the orbits are unremarkable. The visualized portions of the paranasal sinuses and mastoid air cells are unremarkable. Cerebrovascular atherosclerotic calcifications are noted.  The osseous structures are unremarkable.  IMPRESSION: 1. Acute right subdural hematoma measuring 6 mm with 3 mm of right-to-left midline shift. Critical Value/emergent results were called by telephone at the time of interpretation on 08/17/2014 at 3:03 pm to Dr. Charlesetta Shanks , who verbally acknowledged these results.   Electronically Signed   By: Kathreen Devoid   On: 08/17/2014 15:09   Dg Chest Port 1 View  08/17/2014   CLINICAL DATA:  81 year old female with hypoxemia. Difficulty catching her breath.  EXAM: PORTABLE CHEST - 1 VIEW  COMPARISON:  Chest such May 7 08/2014.  FINDINGS: Lung volumes are normal. No consolidative airspace disease. No pleural effusions. Mild diffuse peribronchial cuffing, most notable in the left mid lung. No evidence of pulmonary edema. Heart size is borderline enlarged. The patient is rotated to the left on today's exam, resulting in distortion of the mediastinal contours and reduced diagnostic sensitivity and specificity for mediastinal pathology. Atherosclerosis and tortuosity of  the thoracic aorta.  IMPRESSION: 1. Mild diffuse peribronchial cuffing may suggest bronchitis. 2. Atherosclerosis.   Electronically Signed   By: Vinnie Langton M.D.   On: 08/17/2014 18:24    ROS : as above Blood pressure 130/72, pulse 57, temperature 97.6 F (36.4 C), temperature source Oral, resp.  rate 17, height 5' 4"  (1.626 m), weight 68.04 kg (150 lb), SpO2 97 %. Physical Exam   General: an alert and pleasant 81 year old white female who appears well. She is in no apparent distress.  HEENT: Normocephalic. She has a small laceration in her left parietal scalp which has been sutured. Her pupils are equal round reactive light. She has bilateral lens implants. Extraocular muscles are intact.Nnn  Neck supple, without masses, deformities, tracheal deviation. She has a normal range of motion for her age.  Thorax: Symmetric  Abdomen: Soft  Extremities: Unremarkable  Back exam: Unremarkable  Neurologic exam: The patient is alert and oriented 3. Glasgow Coma Scale 15. Cranial nerves II through XII were examined bilaterally and grossly normal. Vision and hearing are grossly normal bilaterally. Motor strength is 5 over 5 in her bowel biceps, triceps, hand grip, quadriceps, gastrocnemius, dorsiflexors. Cerebellar exam is intact to rapid alternating movements of the upper extremities bilaterally. Sensory function is intact to light touch sensation all tested dermatomes bilaterally.  Imaging studies: I have reviewed the patient's head CT performed today. It demonstrates a small right acute subdural hematoma without significant mass effect.    Assessment/Plan: Small right acute subdural hematoma: The patient is presently neurologically normal. She is not taking any anticoagulatants. I would suggest repeating her CAT scan tomorrow. If it is unchanged then she can be discharged and follow-up with me in the office. I have answered all the patient's questions.  Gari Trovato D 08/17/2014, 8:13 PM

## 2014-08-17 NOTE — ED Notes (Signed)
High Fall Risk measures remain in place

## 2014-08-17 NOTE — ED Notes (Signed)
Pt is high fall risk, arm band on, sr x 2 up, callbell within reach, bed in lowest position, family at side

## 2014-08-17 NOTE — Procedures (Signed)
LACERATION REPAIR Performed by: Devota Pacealeb Brenlyn Beshara Authorized by: Arby BarretteMarcy Pfeiffer, MD Consent: Verbal consent obtained. Risks and benefits: risks, benefits and alternatives were discussed Consent given by: patient Patient identity confirmed: provided demographic data Prepped and Draped in normal sterile fashion Wound explored  Laceration Location: Scalp  Laceration Length: 3 cm  No Foreign Bodies seen or palpated  Anesthesia: local infiltration  Local anesthetic: lidocaine 2% with epinephrine  Anesthetic total: 5 ml  Irrigation method: syringe Amount of cleaning: standard  Skin closure: Primary Intention.   Number of sutures: 4  Technique: Simple Interrupted.   Patient tolerance: Patient tolerated the procedure well with no immediate complications.

## 2014-08-17 NOTE — H&P (Signed)
PULMONARY / CRITICAL CARE MEDICINE   Name: Jade Rodriguez MRN: [id number] DOB: 1923-10-08    ADMISSION DATE:  08/17/2014 CONSULTATION DATE:  08/17/2014  REFERRING MD :  Dr. Lovell Sheehan  CHIEF COMPLAINT:  SDH  INITIAL PRESENTATION: 81 year old female who resides at [facility] SNF fell while transferring to powerchair. Struck head with now LOC. CT scan shows subdural hematoma. Transfer to [facility] for neurosurgical evaluation.  STUDIES:  CT head 7/11 > Acute right subdural hematoma measuring 6 mm with 3 mm of right-to-left midline shift.  SIGNIFICANT EVENTS:   HISTORY OF PRESENT ILLNESS:  81 year old female with PMH as below, which includes HTN, asthma, primary pulm HTN, hypothyroid, migraines, GERD. She resides at [facility] where she was in her USOH until last night 7/10 when she developed L sided headache, which was consistent with her normal migraines. She denied visual disturbances and photophobia. This headache persisted throughout today 7/11 until lunch time. After eating she was transferring from chair to her power-chair and fell. She denies feelings of dizziness during that time. After the fall she was noted to have laceration to back of head with worsened headache. She presented to [facility] ED with this complaint head CT as above with SDH with midline shift. No loss of consciousness noted. Transferred to [facility] for icu admission and neurosurgical evaluation.  PAST MEDICAL HISTORY :   has a past medical history of Hypertension; Falls; Weakness; Asthma; Depression; Primary pulmonary HTN; Hypothyroidism; Edema; Dizziness; Dizziness and giddiness; Migraine; Sinusitis, chronic; GERD (gastroesophageal reflux disease); Osteoarthritis; Constipation; Hyperlipemia; UTI (lower urinary tract infection); and Shoulder pain.  has past surgical history that includes Abdominal hysterectomy; Cholecystectomy; knee relacement; and Knee surgery. Prior to Admission medications   Medication Sig Start Date End Date  Taking? Authorizing Provider  acetaminophen (TYLENOL ARTHRITIS PAIN) 650 MG CR tablet Take 650 mg by mouth every 8 (eight) hours as needed for pain.   Yes Historical Provider, MD  Calcium-Vitamin D-Vitamin K (VIACTIV PO) Take by mouth.   Yes Historical Provider, MD  Calcium-Vitamin D-Vitamin K 500-500-40 MG-UNT-MCG CHEW Chew by mouth.   Yes Historical Provider, MD  Docusate Sodium (COLACE PO) Take by mouth.   Yes Historical Provider, MD  furosemide (LASIX) 20 MG tablet Take 20 mg by mouth.   Yes Historical Provider, MD  GABAPENTIN PO Take by mouth.   Yes Historical Provider, MD  Levothyroxine Sodium (SYNTHROID PO) Take by mouth.   Yes Historical Provider, MD  Loratadine (CLARITIN PO) Take by mouth.   Yes Historical Provider, MD  MECLIZINE HCL PO Take by mouth.   Yes Historical Provider, MD  metaxalone (SKELAXIN) 800 MG tablet Take 800 mg by mouth 3 (three) times daily.   Yes Historical Provider, MD  mometasone (NASONEX) 50 MCG/ACT nasal spray Place 2 sprays into the nose daily.   Yes Historical Provider, MD  Multiple Vitamins-Minerals (CENTRUM SILVER PO) Take by mouth.   Yes Historical Provider, MD  NADOLOL PO Take by mouth.   Yes Historical Provider, MD  OMEPRAZOLE PO Take by mouth.   Yes Historical Provider, MD  prochlorperazine (COMPAZINE) 5 MG tablet Take 5 mg by mouth every 6 (six) hours as needed for nausea or vomiting.   Yes Historical Provider, MD  traMADol (ULTRAM) 50 MG tablet Take by mouth every 6 (six) hours as needed.   Yes Historical Provider, MD  Amlodipine-Olmesartan (AZOR PO) Take by mouth.    Historical Provider, MD  Escitalopram Oxalate (LEXAPRO PO) Take by mouth.  Historical Provider, MD  Fluticasone-Salmeterol (ADVAIR DISKUS IN) Inhale into the lungs.    Historical Provider, MD  NORTRIPTYLINE HCL PO Take by mouth.    Historical Provider, MD  RANITIDINE HCL PO Take by mouth.    Historical Provider, MD  Rizatriptan Benzoate (MAXALT PO) Take by mouth.    Historical Provider,  MD  triamterene-hydrochlorothiazide (MAXZIDE-25) 37.5-25 MG per tablet Take 1 tablet by mouth daily.    Historical Provider, MD   Allergies  Allergen Reactions  . Aspirin     Hx of bleeding ulcer  . Codeine   . Erythromycin Base   . Hydrocodone   . Macrobid [employer] Macro]   . Maxidone [Hydrocodone-Acetaminophen]     rash  . Nitrofurantoin     FAMILY HISTORY:  has no family status information on file.  SOCIAL HISTORY:  reports that she has never smoked. She does not have any smokeless tobacco history on file. She reports that she does not drink alcohol or use illicit drugs.  REVIEW OF SYSTEMS:   Bolds are positive  Constitutional: weight loss, gain, night sweats, Fevers, chills, fatigue .  HEENT: headaches, Sore throat, sneezing, nasal congestion, post nasal drip, Difficulty swallowing, Tooth/dental problems, visual complaints visual changes, ear ache CV:  chest pain, radiates: ,Orthopnea, PND, swelling in lower extremities, dizziness, palpitations, syncope.  GI  heartburn, indigestion, abdominal pain, nausea, vomiting, diarrhea, change in bowel habits, loss of appetite, bloody stools.  Resp: cough, productive: , hemoptysis, dyspnea, chest pain, pleuritic.  Skin: rash or itching or icterus GU: dysuria, change in color of urine, urgency or frequency. flank pain, hematuria  MS: joint pain or swelling. decreased range of motion  Psych: change in mood or affect. depression or anxiety.  Neuro: difficulty with speech, weakness, numbness, ataxia    SUBJECTIVE:   VITAL SIGNS: Temp:  [97.6 F (36.4 C)] 97.6 F (36.4 C) (07/11 1330) Pulse Rate:  [56-58] 56 (07/11 1557) Resp:  [14-18] 14 (07/11 1557) BP: (102-121)/(51-60) 121/60 mmHg (07/11 1557) SpO2:  [96 %-99 %] 99 % (07/11 1557) Weight:  [68.04 kg (150 lb)] 68.04 kg (150 lb) (07/11 1330) HEMODYNAMICS:   VENTILATOR SETTINGS:   INTAKE / OUTPUT: No intake or output data in the 24 hours ending 08/17/14  1729  PHYSICAL EXAMINATION: General:  Elderly female in NAD Neuro:  AAOx3, non-focal, some confusion HEENT:  Lac to occiput, PERRL, no JVD Cardiovascular:  RRR, no MRG Lungs:  Clear bilateral breath sounds Abdomen:  Soft, non-tender, non-distended Musculoskeletal:  No acute deformity or ROM limitation Skin:  Grossly intact  LABS:  CBC  Recent Labs Lab 08/17/14 1508  WBC 9.2  HGB 12.3  HCT 38.2  PLT 223   Coag's  Recent Labs Lab 08/17/14 1508  INR 0.98   BMET  Recent Labs Lab 08/17/14 1508  NA 133*  K 4.4  CL 94*  CO2 28  BUN 34*  CREATININE 1.46*  GLUCOSE 146*   Electrolytes  Recent Labs Lab 08/17/14 1508  CALCIUM 9.4   Sepsis Markers No results for input(s): LATICACIDVEN, PROCALCITON, O2SATVEN in the last 168 hours. ABG No results for input(s): PHART, PCO2ART, PO2ART in the last 168 hours. Liver Enzymes No results for input(s): AST, ALT, ALKPHOS, BILITOT, ALBUMIN in the last 168 hours. Cardiac Enzymes No results for input(s): TROPONINI, PROBNP in the last 168 hours. Glucose No results for input(s): GLUCAP in the last 168 hours.  Imaging Ct Head Wo Contrast  08/17/2014   CLINICAL DATA:  Status post fall.  Small laceration to the left occipital area.  EXAM: CT HEAD WITHOUT CONTRAST  TECHNIQUE: Contiguous axial images were obtained from the base of the skull through the vertex without intravenous contrast.  COMPARISON:  07/17/2014  FINDINGS: There is no evidence of mass effect or midline shift. There is no evidence of a space-occupying lesion. There is hyperdense fluid along the right cerebral convexity measuring 6 mm in thickness consistent with a subdural hemorrhage. There is 3 mm of right to left midline shift. There is no evidence of a cortical-based area of acute infarction. There is generalized cerebral atrophy. There is periventricular white matter low attenuation likely secondary to microangiopathy.  The ventricles and sulci are appropriate for the  patient's age. The basal cisterns are patent.  Visualized portions of the orbits are unremarkable. The visualized portions of the paranasal sinuses and mastoid air cells are unremarkable. Cerebrovascular atherosclerotic calcifications are noted.  The osseous structures are unremarkable.  IMPRESSION: 1. Acute right subdural hematoma measuring 6 mm with 3 mm of right-to-left midline shift. Critical Value/emergent results were called by telephone at the time of interpretation on 08/17/2014 at 3:03 pm to Dr. Arby BarretteMARCY PFEIFFER , who verbally acknowledged these results.   Electronically Signed   By: Elige KoHetal  Patel   On: 08/17/2014 15:09     ASSESSMENT / PLAN:  PULMONARY A: Pulmonary HTN Chronic sinusitis  H/o Asthma  P:   Comfortable on room air Continue Dulera CXR  CARDIOVASCULAR A:  H/o HTN, HLD  P:  Tele Keep SBP < 160 mm/Hg Continue home amlodipine Hold ARB and all diuretics PRN hydralazine  RENAL A:   AKI  P:   Follow [employer] lasix  GASTROINTESTINAL A:   GERD  P:   Heart healthy diet Protonix  HEMATOLOGIC A:   VTE ppx  P:  SCD's Follow CBC  INFECTIOUS A:   No acute evidence for infection  P:   Monitor clinically  ENDOCRINE A:   Hypothyroid Hyperglycemia without history DM    P:   Synthroid TSH Free t4 Monitor glucose on chem > add ssi if consistently greater than 180  NEUROLOGIC A:   R SDH with 3mm midline shift Migraines  P:   RASS goal: 0 Continue home neurontin  Holding lexapro, nortriptyline, ultram  CT scan in AM Frequent neuro checks.   FAMILY  - Updates:   - Inter-disciplinary family meet or Palliative Care meeting due by:  7/18   Joneen RoachPaul Hoffman, AGACNP-BC [facility] Pulmonology/Critical Care Pager [phone] or [phone]  08/17/2014 5:59 PM  Attending Note  81 year old female with extensive PMH who presented to [place] after a fall in the SNF and AMS.  In the ED, patient's head lac was sutured and head CT was  done.  Head CT showed a 6 mm SDH and [facility]  Neurosurgery recommended that patient be admitted to the ICU and they will revaluate.  I reviewed head CT myself, minimal SDH and no significant shift to my eyes.  Discussed case with NS and with PCCM-NP.  AMS: due to fall, now resolved completely.  - Keep under close observation.  SDH: due to fall.  6mm SDH.  - NS called, appreciate input.  - Neuro checks q2 hours.  - Repeat CT in AM to evaluate SDH and shift.  - Monitor coags.  HTN: by history.  On multiple anti-HTN.  - Continue norvasc.  - Hold ARB.  - PRN hydralazine.  - Monitor BP.  Target  SBP per NS.  CHF by history on 3 diuretics.  - Hold diuretics for tonight.  - CXR now and in AM.  - Check BNP.  - If becomes problematic will consider echo.  Hypothyroidism:  - TSH now.  - Free T4 now.  - Synthroid 25 mcg daily for now until able to figure outpatient doses.  Monitor closely in the ICU for signs of neuro deterioration.  If unable to protect airway will intubate and mechanically ventilate.  If stable then will likely transfer out of the ICU and to [facility] in AM.  No family bedside to update.  The patient is critically ill with multiple organ systems failure and requires high complexity decision making for assessment and support, frequent evaluation and titration of therapies, application of advanced monitoring technologies and extensive interpretation of multiple databases.   Critical Care Time devoted to patient care services described in this note is  35  Minutes. This time reflects time of care of this signee Dr Koren Bound. This critical care time does not reflect procedure time, or teaching time or supervisory time of PA/NP/Med student/Med Resident etc but could involve care discussion time.  Alyson Reedy, M.D. [facility] Pulmonary/Critical Care Medicine. Pager: [phone]. After hours pager: [phone].

## 2014-08-17 NOTE — ED Notes (Signed)
Pt assisted living [facility]-brought in by daughter-pt fell while moving from chair to her scooter after lunch today-struck back oh head-denies LOC and neck pain-lac noted-no bleeding-hx of fall x 3/ month

## 2014-08-17 NOTE — ED Notes (Signed)
FAST: negative 

## 2014-08-18 ENCOUNTER — Inpatient Hospital Stay (HOSPITAL_COMMUNITY): Payer: Medicare Other

## 2014-08-18 ENCOUNTER — Encounter (HOSPITAL_COMMUNITY): Payer: Self-pay | Admitting: Radiology

## 2014-08-18 DIAGNOSIS — R40241 Glasgow coma scale score 13-15: Secondary | ICD-10-CM

## 2014-08-18 LAB — BASIC METABOLIC PANEL
Anion gap: 8 (ref 5–15)
BUN: 24 mg/dL — ABNORMAL HIGH (ref 6–20)
CO2: 29 mmol/L (ref 22–32)
Calcium: 9.2 mg/dL (ref 8.9–10.3)
Chloride: 97 mmol/L — ABNORMAL LOW (ref 101–111)
Creatinine, Ser: 1.01 mg/dL — ABNORMAL HIGH (ref 0.44–1.00)
GFR, EST AFRICAN AMERICAN: 56 mL/min — AB (ref >60)
GFR, EST NON AFRICAN AMERICAN: 48 mL/min — AB (ref >60)
Glucose, Bld: 101 mg/dL — ABNORMAL HIGH (ref 65–99)
Potassium: 4.4 mmol/L (ref 3.5–5.1)
Sodium: 134 mmol/L — ABNORMAL LOW (ref 135–145)

## 2014-08-18 LAB — CBC
HCT: 34.7 % — ABNORMAL LOW (ref 36.0–46.0)
HEMOGLOBIN: 11.2 g/dL — AB (ref 12.0–15.0)
MCH: 26.9 pg (ref 26.0–34.0)
MCHC: 32.3 g/dL (ref 30.0–36.0)
MCV: 83.4 fL (ref 78.0–100.0)
Platelets: 201 10*3/uL (ref 150–400)
RBC: 4.16 MIL/uL (ref 3.87–5.11)
RDW: 15.1 % (ref 11.5–15.5)
WBC: 8 10*3/uL (ref 4.0–10.5)

## 2014-08-18 LAB — MAGNESIUM: MAGNESIUM: 1.7 mg/dL (ref 1.7–2.4)

## 2014-08-18 LAB — PHOSPHORUS: Phosphorus: 3.2 mg/dL (ref 2.5–4.6)

## 2014-08-18 MED ORDER — FAMOTIDINE 20 MG PO TABS
20.0000 mg | ORAL_TABLET | Freq: Every day | ORAL | Status: DC
  Administered 2014-08-18 – 2014-08-21 (×4): 20 mg via ORAL
  Filled 2014-08-18 (×5): qty 1

## 2014-08-18 MED ORDER — ACETAMINOPHEN 500 MG PO TABS
1000.0000 mg | ORAL_TABLET | Freq: Three times a day (TID) | ORAL | Status: DC | PRN
  Administered 2014-08-18 – 2014-08-19 (×3): 1000 mg via ORAL
  Filled 2014-08-18 (×3): qty 2

## 2014-08-18 MED ORDER — TRAMADOL HCL 50 MG PO TABS: 50.0000 mg | ORAL_TABLET | ORAL | Status: DC

## 2014-08-18 NOTE — Care Management Note (Signed)
Case Management Note  Patient Details  Name: Lamar BenesMiriam L Kiernan MRN: [id number] Date of Birth: 07/29/1923   Expected Discharge Date:  08/18/2014               Expected Discharge Plan:  Skilled Nursing Facility  In-House Referral:  Clinical Social Work  Discharge planning Services  CM Consult  Post Acute Care Choice:    Choice offered to:     DME Arranged:    DME Agency:     HH Arranged:    HH Agency:     Status of Service:  Completed, signed off  Medicare Important Message Given:    Date Medicare IM Given:    Medicare IM give by:    Date Additional Medicare IM Given:    Additional Medicare Important Message give by:     If discussed at Long Length of Stay Meetings, dates discussed:    Additional Comments: Chart reviewed. SNF placment when medically stable.  Elliot CousinShavis, Virgle Arth Ellen, RN 08/18/2014, 11:25 AM

## 2014-08-18 NOTE — Discharge Summary (Signed)
Physician Discharge Summary  Patient ID: Jade Rodriguez MRN: [id number] DOB/AGE: [id number] 79 y.o.  Admit date: 08/17/2014 Discharge date: 08/18/2014  Problem List Active Problems:   Subdural hematoma   SDH (subdural hematoma)   Altered mental status   Essential hypertension   Hypothyroidism  HPI: 81 year old female with PMH as below, which includes HTN, asthma, primary pulm HTN, hypothyroid, migraines, GERD. She resides at [facility] where she was in her USOH until last night 7/10 when she developed L sided headache, which was consistent with her normal migraines. She denied visual disturbances and photophobia. This headache persisted throughout today 7/11 until lunch time. After eating she was transferring from chair to her power-chair and fell. She denies feelings of dizziness during that time. After the fall she was noted to have laceration to back of head with worsened headache. She presented to [facility] ED with this complaint head CT as above with SDH with midline shift. No loss of consciousness noted. Transferred to [facility] for icu admission and neurosurgical evaluation.   Hospital Course:   ASSESSMENT / PLAN:  PULMONARY A: Pulmonary HTN Chronic sinusitis  H/o Asthma  P:  Comfortable on room air Continue advair CXR  CARDIOVASCULAR A:  H/o HTN, HLD  P:  Tele Keep SBP < 160 mm/Hg Continue home amlodipine Hold ARB and all diuretics for now. Reevaluate at SNF and with input from Dr. Lovell Sheehan of Neurosurgery.   RENAL A:  AKI  P:  Follow [employer] lasix  GASTROINTESTINAL A:  GERD  P:  Heart healthy diet Protonix  HEMATOLOGIC A:  VTE ppx  P:  SCD's Follow CBC  INFECTIOUS A:  No acute evidence for infection  P:  Monitor clinically  ENDOCRINE CBG (last 3)    A:  Hypothyroid Hyperglycemia without history DM   P:  Synthroid TSH Free t4 Follow glucose as needed  NEUROLOGIC A:  R SDH with 3mm midline  shift Migraines No surgical intervention needed  P:  RASS goal: 0 Follow up in 2 weeks with Dr. Lovell Sheehan Sutures x 4 can be removed on 7/18 from scalp.  She is to return to SNF today with plan to upgrade to higher level of care due to SDH, recent fall and advanced age.    Labs at discharge Lab Results  Component Value Date   CREATININE 1.01* 08/18/2014   BUN 24* 08/18/2014   NA 134* 08/18/2014   K 4.4 08/18/2014   CL 97* 08/18/2014   CO2 29 08/18/2014   Lab Results  Component Value Date   WBC 8.0 08/18/2014   HGB 11.2* 08/18/2014   HCT 34.7* 08/18/2014   MCV 83.4 08/18/2014   PLT 201 08/18/2014   Lab Results  Component Value Date   ALT 21 08/17/2014   AST 25 08/17/2014   ALKPHOS 89 08/17/2014   BILITOT 0.3 08/17/2014   Lab Results  Component Value Date   INR 1.08 08/17/2014   INR 0.98 08/17/2014    Current radiology studies Ct Head Wo Contrast  08/18/2014   CLINICAL DATA:  Followup right subdural hematoma  EXAM: CT HEAD WITHOUT CONTRAST  TECHNIQUE: Contiguous axial images were obtained from the base of the skull through the vertex without intravenous contrast.  COMPARISON:  08/17/2014  FINDINGS: Bony calvarium is intact. Diffuse mucosal thickening is noted within the maxillary antra bilaterally stable from the previous exam.  There is again noted a right-sided subdural hematoma. Maximum thickness is approximately 6 mm. There remains minimal midline shift  of approximately 3 mm. No significant increase in degree of hemorrhage is noted. Mild atrophic changes are seen. No findings to suggest acute infarct are noted.  IMPRESSION: Stable right subdural hematoma with minimal midline shift from right to left.   Electronically Signed   By: Alcide Clever M.D.   On: 08/18/2014 07:43   Ct Head Wo Contrast  08/17/2014   CLINICAL DATA:  Status post fall. Small laceration to the left occipital area.  EXAM: CT HEAD WITHOUT CONTRAST  TECHNIQUE: Contiguous axial images were obtained  from the base of the skull through the vertex without intravenous contrast.  COMPARISON:  07/17/2014  FINDINGS: There is no evidence of mass effect or midline shift. There is no evidence of a space-occupying lesion. There is hyperdense fluid along the right cerebral convexity measuring 6 mm in thickness consistent with a subdural hemorrhage. There is 3 mm of right to left midline shift. There is no evidence of a cortical-based area of acute infarction. There is generalized cerebral atrophy. There is periventricular white matter low attenuation likely secondary to microangiopathy.  The ventricles and sulci are appropriate for the patient's age. The basal cisterns are patent.  Visualized portions of the orbits are unremarkable. The visualized portions of the paranasal sinuses and mastoid air cells are unremarkable. Cerebrovascular atherosclerotic calcifications are noted.  The osseous structures are unremarkable.  IMPRESSION: 1. Acute right subdural hematoma measuring 6 mm with 3 mm of right-to-left midline shift. Critical Value/emergent results were called by telephone at the time of interpretation on 08/17/2014 at 3:03 pm to Dr. Arby Barrette , who verbally acknowledged these results.   Electronically Signed   By: Elige Ko   On: 08/17/2014 15:09   Dg Chest Port 1 View  08/17/2014   CLINICAL DATA:  81 year old female with hypoxemia. Difficulty catching her breath.  EXAM: PORTABLE CHEST - 1 VIEW  COMPARISON:  Chest such May 7 08/2014.  FINDINGS: Lung volumes are normal. No consolidative airspace disease. No pleural effusions. Mild diffuse peribronchial cuffing, most notable in the left mid lung. No evidence of pulmonary edema. Heart size is borderline enlarged. The patient is rotated to the left on today's exam, resulting in distortion of the mediastinal contours and reduced diagnostic sensitivity and specificity for mediastinal pathology. Atherosclerosis and tortuosity of the thoracic aorta.  IMPRESSION: 1.  Mild diffuse peribronchial cuffing may suggest bronchitis. 2. Atherosclerosis.   Electronically Signed   By: Trudie Reed M.D.   On: 08/17/2014 18:24    Disposition:  01-Home or Self Care  Discharge Instructions    Discharge to SNF when bed available    Complete by:  As directed             Medication List    STOP taking these medications        COMPAZINE 5 MG tablet  Generic drug:  prochlorperazine     fluconazole 100 MG tablet  Commonly known as:  DIFLUCAN     furosemide 20 MG tablet  Commonly known as:  LASIX     nadolol 20 MG tablet  Commonly known as:  CORGARD     triamterene-hydrochlorothiazide 37.5-25 MG per tablet  Commonly known as:  MAXZIDE-25      TAKE these medications        AZOR 5-40 MG per tablet  Generic drug:  amLODipine-olmesartan  Take 1 tablet by mouth at bedtime.     CRANBERRY PLUS VITAMIN C 4200-20-3 MG-MG-UNIT Caps  Generic drug:  Cranberry-Vitamin C-Vitamin E  Take 1 capsule by mouth daily.     docusate sodium 100 MG capsule  Commonly known as:  COLACE  Take 100 mg by mouth daily as needed for mild constipation.     escitalopram 5 MG tablet  Commonly known as:  LEXAPRO  Take 5 mg by mouth daily.     Fluticasone-Salmeterol 250-50 MCG/DOSE Aepb  Commonly known as:  ADVAIR  Inhale 1 puff into the lungs 2 (two) times daily.     gabapentin 300 MG capsule  Commonly known as:  NEURONTIN  Take 900 mg by mouth at bedtime.     levothyroxine 25 MCG tablet  Commonly known as:  SYNTHROID, LEVOTHROID  Take 25 mcg by mouth daily before breakfast. 6:45am     loratadine 10 MG tablet  Commonly known as:  CLARITIN  Take 10 mg by mouth daily as needed for allergies.     meclizine 25 MG tablet  Commonly known as:  ANTIVERT  Take by mouth 2 (two) times daily as needed for dizziness (giddiness).     metaxalone 800 MG tablet  Commonly known as:  SKELAXIN  Take 400-800 mg by mouth See admin instructions. Take 1/2 - 1 tablet (400-800 mg) at  onset of headache, may repeat in 6-8 hours if needed     mometasone 50 MCG/ACT nasal spray  Commonly known as:  NASONEX  Place 2 sprays into both nostrils daily.     multivitamin with minerals Tabs tablet  Take 1 tablet by mouth daily. Centrum Silver Ultra     nystatin powder  Commonly known as:  MYCOSTATIN  Apply topically 4 (four) times daily.     omeprazole 40 MG capsule  Commonly known as:  PRILOSEC  Take 40 mg by mouth daily before lunch.     ranitidine 300 MG tablet  Commonly known as:  ZANTAC  Take 300 mg by mouth at bedtime.     rizatriptan 10 MG tablet  Commonly known as:  MAXALT  Take 10 mg by mouth daily as needed for migraine. May repeat in 2 hours if needed, max 3 tablets in 24 hours     traMADol 50 MG tablet  Commonly known as:  ULTRAM  Take 50 mg by mouth See admin instructions. Take 1 tablet (50 mg) daily; may take an additional tablet 12 hours later as needed for pain     acetaminophen 500 MG tablet  Commonly known as:  TYLENOL  Take 500 mg by mouth every 12 (twelve) hours as needed (pain).     TYLENOL ARTHRITIS PAIN 650 MG CR tablet  Generic drug:  acetaminophen  Take 1,300 mg by mouth See admin instructions. Take 2 caplets (1300 mg) every morning, may also take  2 caplets (1300 mg) every 8 hours as needed for pain     VIACTIV 500-500-40 MG-UNT-MCG Chew  Generic drug:  Calcium-Vitamin D-Vitamin K  Chew 1 tablet by mouth daily.          Discharged Condition: fair  Time spent on discharge greater than 40 minutes.  Vital signs at Discharge. Temp:  [97.6 F (36.4 C)-98.4 F (36.9 C)] 98.4 F (36.9 C) (07/12 0000) Pulse Rate:  [54-62] 57 (07/12 0800) Resp:  [11-25] 13 (07/12 0800) BP: (102-132)/(48-98) 121/68 mmHg (07/12 0800) SpO2:  [87 %-99 %] 98 % (07/12 0800) Weight:  [150 lb (68.04 kg)-150 lb 2.1 oz (68.1 kg)] 150 lb 2.1 oz (68.1 kg) (07/12 0500) Office follow up Special Information or instructions.  She is to  follow up with Dr. Lovell Sheehan  at [facility] [phone] in 2 weeks She needs sutures from head wound removed on 7/18. Note multiple antihypertensives and diuretics are on hold at time of discharge and will need evaluation for possible restart in future. She is being transferred to [facility] home today Plan per her daughter is to upgrade her to higher level care once back to SNF. She will need bedrest till in higher level of care. No need to follow up with PCCM.  Signed: Brett Canales Artemis Loyal ACNP Adolph Pollack PCCM Pager [phone] till 3 pm If no answer page [phone] 08/18/2014, 8:46 AM

## 2014-08-18 NOTE — Clinical Social Work Note (Signed)
Clinical Social Work Assessment  Patient Details  Name: Jade Rodriguez MRN: [id number] Date of Birth: August 21, 1923  Date of referral:  08/18/14               Reason for consult:  Facility Placement, Other (Comment Required) (From [place])                Permission sought to share information with:  Family Supports Permission granted to share information::  Yes, Verbal Permission Granted  Name::     Limpach,Cheryl  Relationship::  Daughter  Contact Information:  [phone]  Housing/Transportation Living arrangements for the past 2 months:  [place] (From [place]) Source of Information:  Patient, Adult Children Patient Interpreter Needed:  None Criminal Activity/Legal Involvement Pertinent to Current Situation/Hospitalization:  No - Comment as needed Significant Relationships:  Adult Children Lives with:  Self Do you feel safe going back to the place where you live?  No (Needs SNF, unsafe for return to ALF) Need for family participation in patient care:  Yes (Comment)  Care giving concerns:  Patient daughter states that patient was hopeful to go home today, however understanding of the need for continued stay for safety and rehab needs.  Patient daughter very supportive of patient needs and agreeable with patient short term transition from ALF to SNF at [facility].   Social Worker assessment / plan:  Holiday representative met with patient and patient daughter at bedside to offer support and discuss patient needs at discharge.  Patient states that she has been living at [facility] since 2008 and has been to all levels of care throughout being there.  Patient is currently living in the assisted living, however is agreeable to transition to [facility] per MD order.  Patient daughter understanding that Medicare may not cover SNF stay and they could be responsible.  Patient daughter states that she will provide patient transportation to patient at  discharge.  CSW contacted facility who is agreeable with patient transition to SNF and will have a bed available Thursday.  CSW remains available for support and to facilitate patient discharge needs once medically ready.  Employment status:  Retired Forensic scientist:  Medicare PT Recommendations:  [place] / Referral to community resources:  [place]  Patient/Family's Response to care:  Patient and patient daughter understanding of CSW role and appreciative of support and involvement.  Patient and patient daughter understanding of Medicare guidelines and hopeful for SNF coverage.  Patient/Family's Understanding of and Emotional Response to Diagnosis, Current Treatment, and Prognosis:  Patient and patient daughter understanding of patient current status, however patient is anxious to return back to [facility].  Patient family understanding of PT/OT evaluations.  Emotional Assessment Appearance:  Appears younger than stated age Attitude/Demeanor/Rapport:  Other (Appropriate and Cooperative) Affect (typically observed):  Accepting, Happy, Hopeful, Appropriate, Calm Orientation:  Oriented to Self, Oriented to Place, Oriented to  Time, Oriented to Situation Alcohol / Substance use:  Not Applicable Psych involvement (Current and /or in the community):  No (Comment)  Discharge Needs  Concerns to be addressed:  Discharge Planning Concerns, Care Coordination Readmission within the last 30 days:  No Current discharge risk:  Dependent with Mobility, Physical Impairment Barriers to Discharge:  Ship broker, Continued Medical Work up  [employer], [place]

## 2014-08-18 NOTE — Discharge Instructions (Signed)
Head/scalp sutures to be removed on 7/18. Use antibiotic ointment daily on wound. Follow with Dr. Lovell SheehanJenkins in 2 weeks. [phone] You will need higher level of care or sitter for 1-4 days after discharge.

## 2014-08-18 NOTE — Progress Notes (Signed)
Patient requires a higher level of care due to frequent falls.  Per social work, the patient would have to stay here for another 2 days prior to insurance paying for high level of care from assisted level to SNF area of the facility.  The other option is to discharge patient to assisted living and daughter is to call primary to request moving patient to higher level of care.  I offered both options to family, they were very insistent on going home.  I expressed my concern that the patient is very high risk for fall and will need closer supervision.  Daughter will make phone call to primary right now and would like to go home today.  Will discharge patient with instructions for strict bed with bedside commode only with assistance until able to transfer to higher level of care.  I spoke with case management to explore further options as I am very concerned that she goes home not follow instructions and falls again sustaining further injuries but patient is insistent on going home.  Alyson ReedyWesam G. Yacoub, M.D. [facility]. Pager: [phone]. After hours pager: [phone].

## 2014-08-18 NOTE — Progress Notes (Signed)
Patient ID: Jade Rodriguez, female   DOB: 04/21/1923, 81 y.o.   MRN: [id number] Subjective:  He is alert and pleasant and in no apparent distress. She looks well.  Objective: Vital signs in last 24 hours: Temp:  [97.6 F (36.4 C)-98.4 F (36.9 C)] 98.4 F (36.9 C) (07/12 0000) Pulse Rate:  [54-62] 61 (07/12 0600) Resp:  [11-25] 15 (07/12 0600) BP: (102-132)/(48-98) 119/98 mmHg (07/12 0600) SpO2:  [87 %-99 %] 96 % (07/12 0600) Weight:  [68.04 kg (150 lb)-68.1 kg (150 lb 2.1 oz)] 68.1 kg (150 lb 2.1 oz) (07/12 0500)  Intake/Output from previous day: 07/11 0701 - 07/12 0700 In: 240 [P.O.:240] Out: 150 [Urine:150] Intake/Output this shift:    Physical exam the patient is alert and oriented 3. She is moving all 4 extremities well. Speech is normal.  I have reviewed the patient's follow-up head CT. It demonstrates a small right acute subdural hematoma without significant mass effect.  Lab Results:  Recent Labs  08/17/14 1825 08/18/14 0205  WBC 7.8 8.0  HGB 12.0 11.2*  HCT 36.9 34.7*  PLT 221 201   BMET  Recent Labs  08/17/14 1825 08/18/14 0205  NA 132* 134*  K 4.5 4.4  CL 95* 97*  CO2 29 29  GLUCOSE 91 101*  BUN 28* 24*  CREATININE 1.24* 1.01*  CALCIUM 9.2 9.2    Studies/Results: Ct Head Wo Contrast  08/18/2014   CLINICAL DATA:  Followup right subdural hematoma  EXAM: CT HEAD WITHOUT CONTRAST  TECHNIQUE: Contiguous axial images were obtained from the base of the skull through the vertex without intravenous contrast.  COMPARISON:  08/17/2014  FINDINGS: Bony calvarium is intact. Diffuse mucosal thickening is noted within the maxillary antra bilaterally stable from the previous exam.  There is again noted a right-sided subdural hematoma. Maximum thickness is approximately 6 mm. There remains minimal midline shift of approximately 3 mm. No significant increase in degree of hemorrhage is noted. Mild atrophic changes are seen. No findings to suggest acute infarct are noted.   IMPRESSION: Stable right subdural hematoma with minimal midline shift from right to left.   Electronically Signed   By: Alcide Clever M.D.   On: 08/18/2014 07:43   Ct Head Wo Contrast  08/17/2014   CLINICAL DATA:  Status post fall. Small laceration to the left occipital area.  EXAM: CT HEAD WITHOUT CONTRAST  TECHNIQUE: Contiguous axial images were obtained from the base of the skull through the vertex without intravenous contrast.  COMPARISON:  07/17/2014  FINDINGS: There is no evidence of mass effect or midline shift. There is no evidence of a space-occupying lesion. There is hyperdense fluid along the right cerebral convexity measuring 6 mm in thickness consistent with a subdural hemorrhage. There is 3 mm of right to left midline shift. There is no evidence of a cortical-based area of acute infarction. There is generalized cerebral atrophy. There is periventricular white matter low attenuation likely secondary to microangiopathy.  The ventricles and sulci are appropriate for the patient's age. The basal cisterns are patent.  Visualized portions of the orbits are unremarkable. The visualized portions of the paranasal sinuses and mastoid air cells are unremarkable. Cerebrovascular atherosclerotic calcifications are noted.  The osseous structures are unremarkable.  IMPRESSION: 1. Acute right subdural hematoma measuring 6 mm with 3 mm of right-to-left midline shift. Critical Value/emergent results were called by telephone at the time of interpretation on 08/17/2014 at 3:03 pm to Dr. Arby Barrette , who verbally acknowledged these results.  Electronically Signed   By: Elige KoHetal  Patel   On: 08/17/2014 15:09   Dg Chest Port 1 View  08/17/2014   CLINICAL DATA:  81 year old female with hypoxemia. Difficulty catching her breath.  EXAM: PORTABLE CHEST - 1 VIEW  COMPARISON:  Chest such May 7 08/2014.  FINDINGS: Lung volumes are normal. No consolidative airspace disease. No pleural effusions. Mild diffuse peribronchial  cuffing, most notable in the left mid lung. No evidence of pulmonary edema. Heart size is borderline enlarged. The patient is rotated to the left on today's exam, resulting in distortion of the mediastinal contours and reduced diagnostic sensitivity and specificity for mediastinal pathology. Atherosclerosis and tortuosity of the thoracic aorta.  IMPRESSION: 1. Mild diffuse peribronchial cuffing may suggest bronchitis. 2. Atherosclerosis.   Electronically Signed   By: Trudie Reedaniel  Entrikin M.D.   On: 08/17/2014 18:24    Assessment/Plan: Right subdural hematoma: The patient is doing well. Her CT is stable. She can be discharged to home and follow-up with me in the office in about 2 weeks from my point of view. I have answered all the patient's questions.  LOS: 1 day     Roise Emert D 08/18/2014, 7:47 AM

## 2014-08-18 NOTE — Progress Notes (Signed)
Utilization Review Completed.Jade Rodriguez T7/01/2015  

## 2014-08-18 NOTE — H&P (Signed)
PULMONARY / CRITICAL CARE MEDICINE   Name: Lamar BenesMiriam L Wymore MRN: [id number] DOB: 12/19/1923    ADMISSION DATE:  08/17/2014 CONSULTATION DATE:  08/17/2014  REFERRING MD :  Dr. Lovell SheehanJenkins  CHIEF COMPLAINT:  SDH  INITIAL PRESENTATION: [age] year old [facility] fell while transferring to powerchair. Struck head with now LOC. CT scan shows subdural hematoma. [facility].  STUDIES:  CT head 7/11 > Acute right subdural hematoma measuring 6 mm with 3 mm of right-to-left midline shift. CT head 7/12>>>no change, stable  SIGNIFICANT EVENTS: none  SUBJECTIVE: no distress, improved, still with headache  VITAL SIGNS: Temp:  [97.6 F (36.4 C)-98.4 F (36.9 C)] 98.4 F (36.9 C) (07/12 0800) Pulse Rate:  [54-64] 64 (07/12 1000) Resp:  [11-25] 13 (07/12 1000) BP: (102-132)/(48-98) 117/58 mmHg (07/12 1000) SpO2:  [87 %-100 %] 99 % (07/12 1000) Weight:  [68.04 kg (150 lb)-68.1 kg (150 lb 2.1 oz)] 68.1 kg (150 lb 2.1 oz) (07/12 0500) HEMODYNAMICS:   VENTILATOR SETTINGS:   INTAKE / OUTPUT:  Intake/Output Summary (Last 24 hours) at 08/18/14 1124 Last data filed at 08/18/14 1030  Gross per 24 hour  Intake    490 ml  Output    300 ml  Net    190 ml    PHYSICAL EXAMINATION: General:  Elderly female in NAD Neuro:  AAOx3, non-focal 5/5 HEENT:  Lac to occiput, PERRL Cardiovascular:  RRR, no MRG, no heeve, has JVD up Lungs:  CTA Abdomen:  Soft, non-tender, non-distended Musculoskeletal:  No acute deformity or ROM limitation Skin:  Grossly intact  LABS:  CBC  Recent Labs Lab 08/17/14 1508 08/17/14 1825 08/18/14 0205  WBC 9.2 7.8 8.0  HGB 12.3 12.0 11.2*  HCT 38.2 36.9 34.7*  PLT 223 221 201   Coag's  Recent Labs Lab 08/17/14 1508 08/17/14 1825  APTT  --  31  INR 0.98 1.08   BMET  Recent Labs Lab 08/17/14 1508 08/17/14 1825 08/18/14 0205  NA 133* 132* 134*  K 4.4 4.5 4.4  CL 94* 95* 97*  CO2 28 29 29   BUN 34* 28*  24*  CREATININE 1.46* 1.24* 1.01*  GLUCOSE 146* 91 101*   Electrolytes  Recent Labs Lab 08/17/14 1508 08/17/14 1825 08/18/14 0205  CALCIUM 9.4 9.2 9.2  MG  --  1.8 1.7  PHOS  --  3.6 3.2   Sepsis Markers No results for input(s): LATICACIDVEN, PROCALCITON, O2SATVEN in the last 168 hours. ABG No results for input(s): PHART, PCO2ART, PO2ART in the last 168 hours. Liver Enzymes  Recent Labs Lab 08/17/14 1825  AST 25  ALT 21  ALKPHOS 89  BILITOT 0.3  ALBUMIN 3.6   Cardiac Enzymes No results for input(s): TROPONINI, PROBNP in the last 168 hours. Glucose No results for input(s): GLUCAP in the last 168 hours.  Imaging Ct Head Wo Contrast  08/18/2014   CLINICAL DATA:  Followup right subdural hematoma  EXAM: CT HEAD WITHOUT CONTRAST  TECHNIQUE: Contiguous axial images were obtained from the base of the skull through the vertex without intravenous contrast.  COMPARISON:  08/17/2014  FINDINGS: Bony calvarium is intact. Diffuse mucosal thickening is noted within the maxillary antra bilaterally stable from the previous exam.  There is again noted a right-sided subdural hematoma. Maximum thickness is approximately 6 mm. There remains minimal midline shift of approximately 3 mm. No significant increase in degree of hemorrhage is noted. Mild atrophic changes are seen. No findings to suggest  acute infarct are noted.  IMPRESSION: Stable right subdural hematoma with minimal midline shift from right to left.   Electronically Signed   By: Alcide Clever M.D.   On: 08/18/2014 07:43   Ct Head Wo Contrast  08/17/2014   CLINICAL DATA:  Status post fall. Small laceration to the left occipital area.  EXAM: CT HEAD WITHOUT CONTRAST  TECHNIQUE: Contiguous axial images were obtained from the base of the skull through the vertex without intravenous contrast.  COMPARISON:  07/17/2014  FINDINGS: There is no evidence of mass effect or midline shift. There is no evidence of a space-occupying lesion. There is  hyperdense fluid along the right cerebral convexity measuring 6 mm in thickness consistent with a subdural hemorrhage. There is 3 mm of right to left midline shift. There is no evidence of a cortical-based area of acute infarction. There is generalized cerebral atrophy. There is periventricular white matter low attenuation likely secondary to microangiopathy.  The ventricles and sulci are appropriate for the patient's age. The basal cisterns are patent.  Visualized portions of the orbits are unremarkable. The visualized portions of the paranasal sinuses and mastoid air cells are unremarkable. Cerebrovascular atherosclerotic calcifications are noted.  The osseous structures are unremarkable.  IMPRESSION: 1. Acute right subdural hematoma measuring 6 mm with 3 mm of right-to-left midline shift. Critical Value/emergent results were called by telephone at the time of interpretation on 08/17/2014 at 3:03 pm to Dr. Arby Barrette , who verbally acknowledged these results.   Electronically Signed   By: Elige Ko   On: 08/17/2014 15:09   Dg Chest Port 1 View  08/17/2014   CLINICAL DATA:  81 year old female with hypoxemia. Difficulty catching her breath.  EXAM: PORTABLE CHEST - 1 VIEW  COMPARISON:  Chest such May 7 08/2014.  FINDINGS: Lung volumes are normal. No consolidative airspace disease. No pleural effusions. Mild diffuse peribronchial cuffing, most notable in the left mid lung. No evidence of pulmonary edema. Heart size is borderline enlarged. The patient is rotated to the left on today's exam, resulting in distortion of the mediastinal contours and reduced diagnostic sensitivity and specificity for mediastinal pathology. Atherosclerosis and tortuosity of the thoracic aorta.  IMPRESSION: 1. Mild diffuse peribronchial cuffing may suggest bronchitis. 2. Atherosclerosis.   Electronically Signed   By: Trudie Reed M.D.   On: 08/17/2014 18:24     ASSESSMENT / PLAN:  PULMONARY A: Pulmonary HTN Chronic  sinusitis  H/o Asthma  P:   Comfortable on room air Continue Dulera All films reviewed O2 nocturnal at baseline Avoid hypoxia with pulm HTN, avoid sats less 90-92%%  CARDIOVASCULAR A:  H/o HTN, HLD Known PULM HTN P:  Tele Keep SBP < 150 mm/Hg, over next 48 hr would normalize to 130-140 Continue home amlodipine PRN hydralazine Does not sound like PULM is contributing to her falls Etiology falls unclear, assess PA pressures Avoid hypoxia  RENAL A:   AKI Mag 1.7, no pvc  P:   Holding lasix further Follow edema  GASTROINTESTINAL A:   GERD  P:   Heart healthy diet Protonix Add home H2 also , noted zantac Needs slp likely   HEMATOLOGIC A:   VTE ppx  P:  SCD's Limit blood draws  INFECTIOUS A:   No acute evidence for infection  P:   Monitor clinically  ENDOCRINE A:   Hypothyroid Hyperglycemia without history DM    P:   Synthroid TSH, 2.7 Monitor glucose on chem > add ssi if consistently greater than 180  NEUROLOGIC A:   R SDH with 3mm midline shift - unchanged Migraines  P:   Continue home neurontin  Holding lexapro, nortriptyline likely restart lexapro in am  Daughter wants this med dc, may be causing dizziness  - ultram  Frequent neuro checks.   FAMILY  - Updates: daughter 7/12   - Inter-disciplinary family meet or Palliative Care meeting due by:  7/18  Social: needs in house admission still , need to work up reason for falls, assess echo for pa pressures  To [facility] as primary pulm will stay as consultant to assess pa pressures IPF contibution  Mcarthur Rossetti. Tyson Alias, MD, FACP Pgr: [phone] [facility]

## 2014-08-18 NOTE — Progress Notes (Signed)
Pt used BSC and maintained Sp02 >/=92%.

## 2014-08-19 ENCOUNTER — Inpatient Hospital Stay (HOSPITAL_COMMUNITY): Payer: Medicare Other

## 2014-08-19 DIAGNOSIS — I270 Primary pulmonary hypertension: Secondary | ICD-10-CM

## 2014-08-19 DIAGNOSIS — R296 Repeated falls: Secondary | ICD-10-CM

## 2014-08-19 DIAGNOSIS — S0101XA Laceration without foreign body of scalp, initial encounter: Secondary | ICD-10-CM | POA: Diagnosis present

## 2014-08-19 LAB — AMMONIA: AMMONIA: 63 umol/L — AB (ref 9–35)

## 2014-08-19 LAB — BASIC METABOLIC PANEL
ANION GAP: 10 (ref 5–15)
BUN: 17 mg/dL (ref 6–20)
CO2: 24 mmol/L (ref 22–32)
CREATININE: 0.85 mg/dL (ref 0.44–1.00)
Calcium: 9.3 mg/dL (ref 8.9–10.3)
Chloride: 95 mmol/L — ABNORMAL LOW (ref 101–111)
GFR calc Af Amer: >60 mL/min (ref >60)
GFR calc non Af Amer: 59 mL/min — ABNORMAL LOW (ref >60)
GLUCOSE: 135 mg/dL — AB (ref 65–99)
POTASSIUM: 3.9 mmol/L (ref 3.5–5.1)
SODIUM: 129 mmol/L — AB (ref 135–145)

## 2014-08-19 LAB — VITAMIN B12: Vitamin B-12: 648 pg/mL (ref 180–914)

## 2014-08-19 MED ORDER — FLUTICASONE PROPIONATE 50 MCG/ACT NA SUSP
2.0000 | Freq: Every day | NASAL | Status: DC
Start: 2014-08-19 — End: 2014-08-21
  Administered 2014-08-19 – 2014-08-21 (×3): 2 via NASAL
  Filled 2014-08-19: qty 16

## 2014-08-19 MED ORDER — OXYCODONE HCL 5 MG PO TABS: 5.0000 mg | ORAL_TABLET | Freq: Four times a day (QID) | ORAL | Status: DC | PRN

## 2014-08-19 MED ORDER — GABAPENTIN 300 MG PO CAPS
300.0000 mg | ORAL_CAPSULE | Freq: Every day | ORAL | Status: DC
  Administered 2014-08-19: 300 mg via ORAL
  Filled 2014-08-19: qty 1

## 2014-08-19 MED ORDER — ESCITALOPRAM OXALATE 10 MG PO TABS
5.0000 mg | ORAL_TABLET | Freq: Every day | ORAL | Status: DC
  Administered 2014-08-19 – 2014-08-21 (×3): 5 mg via ORAL
  Filled 2014-08-19 (×3): qty 1

## 2014-08-19 MED ORDER — LEVOTHYROXINE SODIUM 25 MCG PO TABS: 25.0000 ug | ORAL_TABLET | Freq: Every day | ORAL | Status: DC

## 2014-08-19 MED ORDER — ACETAMINOPHEN 325 MG PO TABS
650.0000 mg | ORAL_TABLET | Freq: Three times a day (TID) | ORAL | Status: DC
  Administered 2014-08-19 – 2014-08-21 (×6): 650 mg via ORAL
  Filled 2014-08-19 (×6): qty 2

## 2014-08-19 MED ORDER — MOMETASONE FURO-FORMOTEROL FUM 100-5 MCG/ACT IN AERO: 2.0000 | INHALATION_SPRAY | Freq: Two times a day (BID) | RESPIRATORY_TRACT | Status: DC

## 2014-08-19 MED ORDER — DOCUSATE SODIUM 100 MG PO CAPS
100.0000 mg | ORAL_CAPSULE | Freq: Two times a day (BID) | ORAL | Status: DC
  Administered 2014-08-19 (×2): 100 mg via ORAL
  Filled 2014-08-19 (×2): qty 1

## 2014-08-19 MED ORDER — ADULT MULTIVITAMIN W/MINERALS CH
1.0000 | ORAL_TABLET | Freq: Every day | ORAL | Status: DC
  Administered 2014-08-19 – 2014-08-21 (×3): 1 via ORAL
  Filled 2014-08-19 (×3): qty 1

## 2014-08-19 NOTE — Progress Notes (Signed)
[facility] PROGRESS NOTE  Jade Rodriguez ZOX:[id number] DOB: 15-Nov-1923 DOA: 08/17/2014 PCP: No primary care provider on file.  Assessment/Plan: Chart reviewed. D/w Dr. Brooks Sailors  Principal Problem:   Subdural hematoma: awaiting placement Active Problems:   Essential hypertension   Hypothyroidism: TSH ok   Frequent falls: echo pending. Will check orthostatics, carotid doppler, B12, folate, ammonia.  On multiple sedating medications as outpt. Will decrease or hold   Scalp laceration   SNF tomorrow if stable  HPI/Subjective: C/o HA. Has had multiple falls with head injury.  Objective: Filed Vitals:   08/19/14 0916  BP: 116/72  Pulse: 78  Temp: 98 F (36.7 C)  Resp: 16    Intake/Output Summary (Last 24 hours) at 08/19/14 1356 Last data filed at 08/19/14 0844  Gross per 24 hour  Intake    360 ml  Output    100 ml  Net    260 ml   Filed Weights   08/17/14 1330 08/18/14 0500 08/19/14 0640  Weight: 68.04 kg (150 lb) 68.1 kg (150 lb 2.1 oz) 67.8 kg (149 lb 7.6 oz)    Exam:   General:  A and o. Comfortable  HEENT:  Hematoma on occiput. Laceration posterior scalp  Cardiovascular: RRR without MGR  Respiratory: cTA witHout WRR  Abdomen: S, nt, nd  Ext: no CCE  Neuro: nonfocal.  Basic Metabolic Panel:  Recent Labs Lab 08/17/14 1508 08/17/14 1825 08/18/14 0205  NA 133* 132* 134*  K 4.4 4.5 4.4  CL 94* 95* 97*  CO2 GLUCOSE 146* 91 101*  BUN 34* 28* 24*  CREATININE 1.46* 1.24* 1.01*  CALCIUM 9.4 9.2 9.2  MG  --  1.8 1.7  PHOS  --  3.6 3.2   Liver Function Tests:  Recent Labs Lab 08/17/14 1825  AST 25  ALT 21  ALKPHOS 89  BILITOT 0.3  PROT 6.4*  ALBUMIN 3.6   No results for input(s): LIPASE, AMYLASE in the last 168 hours. No results for input(s): AMMONIA in the last 168 hours. CBC:  Recent Labs Lab 08/17/14 1508 08/17/14 1825 08/18/14 0205  WBC 9.2 7.8 8.0  NEUTROABS 7.0 5.5  --   HGB 12.3 12.0 11.2*  HCT 38.2  36.9 34.7*  MCV 84.9 83.3 83.4  PLT 223 221 201   Cardiac Enzymes: No results for input(s): CKTOTAL, CKMB, CKMBINDEX, TROPONINI in the last 168 hours. BNP (last 3 results) No results for input(s): BNP in the last 8760 hours.  ProBNP (last 3 results) No results for input(s): PROBNP in the last 8760 hours.  CBG: No results for input(s): GLUCAP in the last 168 hours.  Recent Results (from the past 240 hour(s))  MRSA PCR Screening     Status: None   Collection Time: 08/17/14  7:00 PM  Result Value Ref Range Status   MRSA by PCR NEGATIVE NEGATIVE Final    Comment:        The GeneXpert MRSA Assay (FDA approved for NASAL specimens only), is one component of a comprehensive MRSA colonization surveillance program. It is not intended to diagnose MRSA infection nor to guide or monitor treatment for MRSA infections.      Studies: Ct Head Wo Contrast  08/18/2014   CLINICAL DATA:  Followup right subdural hematoma  EXAM: CT HEAD WITHOUT CONTRAST  TECHNIQUE: Contiguous axial images were obtained from the base of the skull through the vertex without intravenous contrast.  COMPARISON:  08/17/2014  FINDINGS: Bony calvarium is intact. Diffuse mucosal  thickening is noted within the maxillary antra bilaterally stable from the previous exam.  There is again noted a right-sided subdural hematoma. Maximum thickness is approximately 6 mm. There remains minimal midline shift of approximately 3 mm. No significant increase in degree of hemorrhage is noted. Mild atrophic changes are seen. No findings to suggest acute infarct are noted.  IMPRESSION: Stable right subdural hematoma with minimal midline shift from right to left.   Electronically Signed   By: Alcide CleverMark  Lukens M.D.   On: 08/18/2014 07:43   Ct Head Wo Contrast  08/17/2014   CLINICAL DATA:  Status post fall. Small laceration to the left occipital area.  EXAM: CT HEAD WITHOUT CONTRAST  TECHNIQUE: Contiguous axial images were obtained from the base of the  skull through the vertex without intravenous contrast.  COMPARISON:  07/17/2014  FINDINGS: There is no evidence of mass effect or midline shift. There is no evidence of a space-occupying lesion. There is hyperdense fluid along the right cerebral convexity measuring 6 mm in thickness consistent with a subdural hemorrhage. There is 3 mm of right to left midline shift. There is no evidence of a cortical-based area of acute infarction. There is generalized cerebral atrophy. There is periventricular white matter low attenuation likely secondary to microangiopathy.  The ventricles and sulci are appropriate for the patient's age. The basal cisterns are patent.  Visualized portions of the orbits are unremarkable. The visualized portions of the paranasal sinuses and mastoid air cells are unremarkable. Cerebrovascular atherosclerotic calcifications are noted.  The osseous structures are unremarkable.  IMPRESSION: 1. Acute right subdural hematoma measuring 6 mm with 3 mm of right-to-left midline shift. Critical Value/emergent results were called by telephone at the time of interpretation on 08/17/2014 at 3:03 pm to Dr. Arby BarretteMARCY PFEIFFER , who verbally acknowledged these results.   Electronically Signed   By: Elige KoHetal  Patel   On: 08/17/2014 15:09   Dg Chest Port 1 View  08/17/2014   CLINICAL DATA:  81 year old female with hypoxemia. Difficulty catching her breath.  EXAM: PORTABLE CHEST - 1 VIEW  COMPARISON:  Chest such May 7 08/2014.  FINDINGS: Lung volumes are normal. No consolidative airspace disease. No pleural effusions. Mild diffuse peribronchial cuffing, most notable in the left mid lung. No evidence of pulmonary edema. Heart size is borderline enlarged. The patient is rotated to the left on today's exam, resulting in distortion of the mediastinal contours and reduced diagnostic sensitivity and specificity for mediastinal pathology. Atherosclerosis and tortuosity of the thoracic aorta.  IMPRESSION: 1. Mild diffuse  peribronchial cuffing may suggest bronchitis. 2. Atherosclerosis.   Electronically Signed   By: Trudie Reedaniel  Entrikin M.D.   On: 08/17/2014 18:24    Scheduled Meds: . acetaminophen  650 mg Oral TID  . amLODipine  5 mg Oral Daily  . docusate sodium  100 mg Oral BID  . escitalopram  5 mg Oral Daily  . famotidine  20 mg Oral Daily  . fluticasone  2 spray Each Nare Daily  . gabapentin  300 mg Oral QHS  . levothyroxine  25 mcg Oral QAC breakfast  . mometasone-formoterol  2 puff Inhalation BID  . multivitamin with minerals  1 tablet Oral Daily   Continuous Infusions:   Time spent: 35 minutes  Tayquan Gassman L  [facility] [email or web address], password [email or web address] 08/19/2014, 1:56 PM  LOS: 2 days

## 2014-08-19 NOTE — Progress Notes (Signed)
Occupational Therapy Evaluation Patient Details Name: Jade Rodriguez MRN: [id number] DOB: 1923-08-31 Today's Date: 08/19/2014    History of Present Illness 81 year old female who resides at [facility] SN[facility] fell while transferring to [facility]. Struck head with now LOC. CT scan shows subdural hematoma.    Clinical Impression   Pt admitted with the above diagnoses and presents with below problem list. Pt will benefit from continued OT to address the below listed deficits and maximize independence with BADLs prior to d/c back to [facility] with OT services. PTA pt was mod I to min A with ADLs as detailed below. Pt is currently min guard to min A for ADLs as detailed below. Recommend OT services to continue to address balance deficits     Follow Up Recommendations  SNF;Other (comment) (OT services at [facility]) 24/7 supervision/assist   Equipment Recommendations  None recommended by OT    Recommendations for Other Services PT consult     Precautions / Restrictions Precautions Precautions: Fall Restrictions Weight Bearing Restrictions: No      Mobility Bed Mobility               General bed mobility comments: in recliner  Transfers Overall transfer level: Needs assistance Equipment used: Rolling walker (2 wheeled) Transfers: Sit to/from Stand Sit to Stand: Min assist         General transfer comment: Light min A to control descent into recliner during stand>sit. Cues for hand placement with rw.    Balance Overall balance assessment: Needs assistance;History of Falls Sitting-balance support: No upper extremity supported;Feet supported Sitting balance-Leahy Scale: Good     Standing balance support: Bilateral upper extremity supported;During functional activity Standing balance-Leahy Scale: Poor Standing balance comment: rw for balance                            ADL Overall ADL's : Needs assistance/impaired Eating/Feeding: Set up;Sitting    Grooming: Wash/dry hands;Wash/dry face;Min guard;Standing Grooming Details (indicate cue type and reason): min guard for balance Upper Body Bathing: Sitting;Minimal assitance Upper Body Bathing Details (indicate cue type and reason): min A for hair washing Lower Body Bathing: Min guard;With adaptive equipment;Sit to/from stand;Minimal assistance   Upper Body Dressing : Set up;Sitting   Lower Body Dressing: Min guard;With adaptive equipment;Sit to/from stand;Minimal assistance   Toilet Transfer: Min guard;Ambulation;Comfort height toilet;RW   Toileting- Architect and Hygiene: Min guard;Sit to/from stand   Tub/ Shower Transfer: Min guard;Ambulation;Rolling walker   Functional mobility during ADLs: Min guard;Rolling walker General ADL Comments: Pt completed in-room ambulation at min guard level. Pt completed hand and face washing standing at sink at min guard level. Pt completed functional tasks in vertical place at min guard level. Pt recently transferred to skilled unit from ILF and has needed to have supervison for showering due to shower stall in apartment being too small for shower seat. Pt needed light min A during stand>sit to control descent into recliner.     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   Lower Extremity Assessment Lower Extremity Assessment:  Defer to PT evaluation       Communication Communication Communication: No difficulties   Cognition Arousal/Alertness: Awake/alert Behavior During Therapy: WFL for tasks  assessed/performed Overall Cognitive Status: Within Functional Limits for tasks assessed                     General Comments       Exercises       Shoulder Instructions      Home Living Family/patient expects to be discharged to:: Assisted living                             Home Equipment: Walker - 4 wheels;Cane - single point;Wheelchair - power   Additional Comments: Pt has shower seat but reports that since moving to skillied unit a month ago her walk-in shower is too narrow for the shower seat.       Prior Functioning/Environment Level of Independence: Needs assistance  Gait / Transfers Assistance Needed: rw for apartment distances; power chair for navigating to other areas of SNF ADL's / Homemaking Assistance Needed: min A for hair washing, recently supervision level for showers due to shower seat not fitting in shower in new unit.        OT Diagnosis: Generalized weakness;Other (comment) (impaired balance)   OT Problem List: Decreased range of motion;Impaired balance (sitting and/or standing);Decreased knowledge of use of DME or AE;Decreased knowledge of precautions   OT Treatment/Interventions: Self-care/ADL training;Therapeutic exercise;DME and/or AE instruction;Therapeutic activities;Patient/family education;Balance training    OT Goals(Current goals can be found in the care plan section) Acute Rehab OT Goals Patient Stated Goal: return [place] [facility] with therapy services OT Goal Formulation: With patient/family Time For Goal Achievement: 09/02/14 Potential to Achieve Goals: Good ADL Goals Pt Will Perform Grooming: with modified independence;with adaptive equipment;standing Pt Will Perform Lower Body Bathing: with modified independence;with adaptive equipment;sit to/from stand Pt Will Perform Lower Body Dressing: with modified independence;with adaptive equipment;sit to/from stand Pt Will Transfer to Toilet: with modified  independence;ambulating (3n1 over toilet)  OT Frequency: Min 2X/week   Barriers to D/C:            Co-evaluation              End of Session Equipment Utilized During Treatment: Gait belt;Rolling walker  Activity Tolerance: Patient tolerated treatment well Patient left: in chair;with call bell/phone within reach;with chair alarm set;with family/visitor present   Time: [id number] OT Time Calculation (min): 30 min Charges:  OT General Charges $OT Visit: 1 Procedure OT Evaluation $Initial OT Evaluation Tier I: 1 Procedure OT Treatments $Self Care/Home Management : 8-22 mins G-Codes:    Pilar GrammesMathews, Plumer Mittelstaedt H 08/19/2014, 10:14 AM

## 2014-08-19 NOTE — Progress Notes (Signed)
  Echocardiogram 2D Echocardiogram has been performed.  Jade Rodriguez, Jade Rodriguez 08/19/2014, 5:05 PM

## 2014-08-19 NOTE — Evaluation (Signed)
Physical Therapy Evaluation Patient Details Name: Jade Rodriguez MRN: [id number] DOB: 1923/03/15 Today's Date: 08/19/2014   History of Present Illness  81 year old female who resides at [facility] fell while transferring to [facility]. Struck head with now LOC. CT scan shows subdural hematoma.   Clinical Impression  Pt admitted with the above diagnosis. Pt currently with functional limitations due to the deficits listed below (see PT Problem List). Presents with generalized weakness and complains of mild headache today during therapy evaluation. Ambulating up to 65 feet with close guard for safety. Denies any dizziness during therapy session. Would greatly benefit from ST SNF to improve safety and function prior to returning to ALF due to Hx of multiple falls. Pt will benefit from skilled PT to increase their independence and safety with mobility to allow discharge to the venue listed below.       Follow Up Recommendations SNF    Equipment Recommendations  None recommended by PT    Recommendations for Other Services       Precautions / Restrictions Precautions Precautions: Fall Restrictions Weight Bearing Restrictions: No      Mobility  Bed Mobility               General bed mobility comments: in recliner  Transfers Overall transfer level: Needs assistance Equipment used: Rolling walker (2 wheeled) Transfers: Sit to/from Stand Sit to Stand: Min guard         General transfer comment: Close guard for safety. VC for hand placement. Fairly stable with UE support on RW upon standing.  Ambulation/Gait Ambulation/Gait assistance: Min guard Ambulation Distance (Feet): 65 Feet Assistive device: Rolling walker (2 wheeled) Gait Pattern/deviations: Step-through pattern;Decreased step length - left;Decreased stance time - right;Decreased stride length;Trendelenburg;Trunk flexed Gait velocity: Decreased   General Gait Details: Educated on safe DME use with a rolling  walker. VC for upright posture. Notable Rt trendelenburg which daughter reports improved after physical therapy in the past but has recently returned. Pt denies pain in Rt hip. Cues for walker placement for proximity with turns. No buckling of LEs noted.  Stairs            Wheelchair Mobility    Modified Rankin (Stroke Patients Only)       Balance Overall balance assessment: Needs assistance;History of Falls Sitting-balance support: No upper extremity supported;Feet supported Sitting balance-Leahy Scale: Good     Standing balance support: Bilateral upper extremity supported;During functional activity Standing balance-Leahy Scale: Poor Standing balance comment: rw for balance                             Pertinent Vitals/Pain Pain Assessment: Faces Faces Pain Scale: Hurts a little bit Pain Location: Head Pain Descriptors / Indicators: Aching Pain Intervention(s): Monitored during session;Repositioned    Home Living Family/patient expects to be discharged to:: Skilled nursing facility               Home Equipment: Walker - 4 wheels;Cane - single point;Wheelchair - power Additional Comments: Pt has shower seat but reports that since moving to skillied unit a month ago her walk-in shower is too narrow for the shower seat.     Prior Function Level of Independence: Needs assistance   Gait / Transfers Assistance Needed: rw for apartment distances; power chair for navigating to other areas of SNF  ADL's / Homemaking Assistance Needed: min A for hair washing, recently supervision level for showers due to shower seat  not fitting in shower in new unit.        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 Both agree she would beenfit from physical therapy due to generalized weakness and hx of frequent falls.    Exercises General Exercises - Lower Extremity Ankle Circles/Pumps: AROM;Both;10 reps;Seated      Assessment/Plan    PT Assessment Patient needs continued PT services  PT Diagnosis Difficulty walking;Abnormality of gait;Generalized weakness;Acute pain   PT Problem List Decreased strength;Decreased activity tolerance;Decreased balance;Decreased mobility;Decreased knowledge of use of DME;Pain  PT Treatment Interventions DME instruction;Gait training;Functional mobility training;Therapeutic activities;Therapeutic exercise;Balance training;Neuromuscular re-education;Patient/family education   PT Goals (Current goals can be found in the Care Plan section) Acute Rehab PT Goals Patient Stated Goal[facility] PT Goal Formulation: With patient/family Time For Goal Achievement: 09/02/14 Potential to Achieve Goals: Good    Frequency Min 3X/week   Barriers to discharge Decreased caregiver support From assisted living facility    Co-evaluation               End of Session Equipment Utilized During Treatment: Gait belt Activity Tolerance: Patient tolerated treatment well Patient left: in chair;with call bell/phone within reach;with chair alarm set;with family/visitor present Nurse Communication: Mobility status         Time: [id number] PT Time Calculation (min) (ACUTE ONLY): 17  min   Charges:   PT Evaluation $Initial PT Evaluation Tier I: 1 Procedure     PT G CodesBerton Mount:        Zailah Zagami S 08/19/2014, 11:42 AM Charlsie MerlesLogan Secor Accalia Rigdon, PT [phone]

## 2014-08-19 NOTE — Care Management Important Message (Signed)
Important Message  Patient Details  Name: Jade BenesMiriam L Lacock MRN: [id number] Date of Birth: 06/03/1923   Medicare Important Message Given:  Yes-second notification given    Orson AloeMegan P Minaal Struckman 08/19/2014, 11:40 AM

## 2014-08-20 ENCOUNTER — Inpatient Hospital Stay (HOSPITAL_COMMUNITY): Payer: Medicare Other

## 2014-08-20 ENCOUNTER — Encounter (HOSPITAL_COMMUNITY): Payer: Medicare Other

## 2014-08-20 DIAGNOSIS — E7220 Disorder of urea cycle metabolism, unspecified: Secondary | ICD-10-CM | POA: Diagnosis present

## 2014-08-20 DIAGNOSIS — R296 Repeated falls: Secondary | ICD-10-CM

## 2014-08-20 MED ORDER — GABAPENTIN 300 MG PO CAPS
600.0000 mg | ORAL_CAPSULE | Freq: Every day | ORAL | Status: DC
  Administered 2014-08-20: 600 mg via ORAL
  Filled 2014-08-20: qty 2

## 2014-08-20 MED ORDER — LACTULOSE 10 GM/15ML PO SOLN
30.0000 g | Freq: Every day | ORAL | Status: DC
  Administered 2014-08-20 – 2014-08-21 (×2): 30 g via ORAL
  Filled 2014-08-20 (×2): qty 45

## 2014-08-20 MED ORDER — DIPHENHYDRAMINE HCL 12.5 MG/5ML PO ELIX
12.5000 mg | ORAL_SOLUTION | Freq: Once | ORAL | Status: AC
  Administered 2014-08-20: 12.5 mg via ORAL
  Filled 2014-08-20: qty 10

## 2014-08-20 MED ORDER — TRAZODONE HCL 50 MG PO TABS: 50.0000 mg | ORAL_TABLET | Freq: Every evening | ORAL | Status: DC | PRN

## 2014-08-20 MED ORDER — DIPHENHYDRAMINE HCL 12.5 MG/5ML PO ELIX: 12.5000 mg | ORAL_SOLUTION | Freq: Once | ORAL | Status: DC

## 2014-08-20 NOTE — Progress Notes (Signed)
VASCULAR LAB PRELIMINARY  PRELIMINARY  PRELIMINARY  PRELIMINARY  Carotid duplex  completed.    Preliminary report:  Bilateral:  1-39% ICA stenosis.  Vertebral artery flow is antegrade.      Smith Potenza, RVT 08/20/2014, 12:26 PM

## 2014-08-20 NOTE — Progress Notes (Signed)
[facility] PROGRESS NOTE  Jade Rodriguez ZOX:[id number] DOB: Jun 03, 1923 DOA: 08/17/2014 PCP: No primary care provider on file.  Assessment/Plan: Chart reviewed. D/w Dr. Brooks Rodriguez  Principal Problem:   Subdural hematoma: awaiting placement Active Problems:   Essential hypertension   Hypothyroidism: TSH ok   Frequent falls: echo ok. orthos ok, carotid doppler pending, B12 ok , folate pending, ammonia slightly high.  On multiple sedating medications as outpt. Likely medication related and possibly hyper ammonemia pending Hyperammonemia:  Mild. No known h/o liver ds and not on meds that would cause this.  Will check [place] abd   Scalp laceration Insomnia: back on gabapentin, but lower dose. Increase to 600   SNF tomorrow if stable  HPI/Subjective: C/o insomnia Objective: Filed Vitals:   08/20/14 1000  BP: 131/74  Pulse: 77  Temp: 97.6 F (36.4 C)  Resp: 17   No intake or output data in the 24 hours ending 08/20/14 1119 Filed Weights   08/18/14 0500 08/19/14 0640 08/20/14 0656  Weight: 68.1 kg (150 lb 2.1 oz) 67.8 kg (149 lb 7.6 oz) 68.312 kg (150 lb 9.6 oz)    Exam:   General:  A and o. Comfortable  HEENT:  Hematoma on occiput. Laceration posterior scalp  Cardiovascular: RRR without MGR  Respiratory: cTA witHout WRR  Abdomen: S, nt, nd  Ext: no CCE  Neuro: nonfocal.  Basic Metabolic Panel:  Recent Labs Lab 08/17/14 1508 08/17/14 1825 08/18/14 0205 08/19/14 1448  NA 133* 132* 134* 129*  K 4.4 4.5 4.4 3.9  CL 94* 95* 97* 95*  CO2 GLUCOSE 146* 91 101* 135*  BUN 34* 28* 24* 17  CREATININE 1.46* 1.24* 1.01* 0.85  CALCIUM 9.4 9.2 9.2 9.3  MG  --  1.8 1.7  --   PHOS  --  3.6 3.2  --    Liver Function Tests:  Recent Labs Lab 08/17/14 1825  AST 25  ALT 21  ALKPHOS 89  BILITOT 0.3  PROT 6.4*  ALBUMIN 3.6   No results for input(s): LIPASE, AMYLASE in the last 168 hours.  Recent Labs Lab 08/19/14 1448  AMMONIA 63*    CBC:  Recent Labs Lab 08/17/14 1508 08/17/14 1825 08/18/14 0205  WBC 9.2 7.8 8.0  NEUTROABS 7.0 5.5  --   HGB 12.3 12.0 11.2*  HCT 38.2 36.9 34.7*  MCV 84.9 83.3 83.4  PLT 223 221 201   Cardiac Enzymes: No results for input(s): CKTOTAL, CKMB, CKMBINDEX, TROPONINI in the last 168 hours. BNP (last 3 results) No results for input(s): BNP in the last 8760 hours.  ProBNP (last 3 results) No results for input(s): PROBNP in the last 8760 hours.  CBG: No results for input(s): GLUCAP in the last 168 hours.  Recent Results (from the past 240 hour(s))  MRSA PCR Screening     Status: None   Collection Time: 08/17/14  7:00 PM  Result Value Ref Range Status   MRSA by PCR NEGATIVE NEGATIVE Final    Comment:        The GeneXpert MRSA Assay (FDA approved for NASAL specimens only), is one component of a comprehensive MRSA colonization surveillance program. It is not intended to diagnose MRSA infection nor to guide or monitor treatment for MRSA infections.      Studies: No results found.  Scheduled Meds: . acetaminophen  650 mg Oral TID  . amLODipine  5 mg Oral Daily  . escitalopram  5 mg Oral Daily  . famotidine  20 mg Oral Daily  . fluticasone  2 spray Each Nare Daily  . gabapentin  600 mg Oral QHS  . lactulose  30 g Oral Daily  . levothyroxine  25 mcg Oral QAC breakfast  . mometasone-formoterol  2 puff Inhalation BID  . multivitamin with minerals  1 tablet Oral Daily   Continuous Infusions:   Time spent: 35 minutes  Jade Rodriguez L  [facility] [email or web address], password [email or web address] 08/20/2014, 11:19 AM  LOS: 3 days

## 2014-08-21 LAB — FOLATE RBC
Folate, Hemolysate: >620 ng/mL
Folate, RBC: >1606 ng/mL (ref >498)
HEMATOCRIT: 38.6 % (ref 34.0–46.6)

## 2014-08-21 MED ORDER — ACETAMINOPHEN 325 MG PO TABS: 650.0000 mg | ORAL_TABLET | Freq: Three times a day (TID) | ORAL | Status: AC

## 2014-08-21 MED ORDER — AMLODIPINE BESYLATE 5 MG PO TABS: 5.0000 mg | ORAL_TABLET | Freq: Every day | ORAL | Status: DC

## 2014-08-21 MED ORDER — GABAPENTIN 300 MG PO CAPS: 600.0000 mg | ORAL_CAPSULE | Freq: Every day | ORAL | Status: AC

## 2014-08-21 MED ORDER — LACTULOSE 10 GM/15ML PO SOLN: 30.0000 g | Freq: Every day | ORAL | Status: DC

## 2014-08-21 NOTE — Discharge Summary (Signed)
Physician Discharge Summary  Jade Rodriguez ZOX:[id number] DOB: [birth year] DOA: 08/17/2014  PCP: No primary care provider on file.  Admit date: 08/17/2014 Discharge date: 08/21/2014  Time spent: greater than 30 minutes  Recommendations for Outpatient Follow-up:  1. Monitor ammonia level within one week and adjust lactulose dose as needed 2. 2 liters oxygen nightly 3. Avoid medications that would potentiate risk for falls 4. Remove scalp laceration stitches in a week. 5. To skilled nursing facility 6. Adjust antihypertensives as needed. 7. Folate pending at the time of discharge.  Discharge Diagnoses:  Principal Problem:   Subdural hematoma Active Problems:   Essential hypertension   Hypothyroidism   Frequent falls   Scalp laceration   Hyperammonemia mild pulmonary hypertension  Discharge Condition: Stable  Diet recommendation: Heart healthy  Activity: Walk with assistance. Fall precautions.  Filed Weights   08/19/14 0640 08/20/14 0656 08/21/14 8119  Weight: 67.8 kg (149 lb 7.6 oz) 68.312 kg (150 lb 9.6 oz) 65.091 kg (143 lb 8 oz)    History of present illness:  81 year old female with PMH as below, which includes HTN, asthma, primary pulm HTN, hypothyroid, migraines, GERD. She resides at [facility] where she was in her USOH until last night 7/10 when she developed L sided headache, which was consistent with her normal migraines. She denied visual disturbances and photophobia. This headache persisted throughout today 7/11 until lunch time. After eating she was transferring from chair to her power-chair and fell. She denies feelings of dizziness during that time. After the fall she was noted to have laceration to back of head with worsened headache. She presented to [facility] ED with this complaint head CT as above with SDH with midline shift. No loss of consciousness noted. Transferred to [facility] for icu admission and neurosurgical evaluation.  Hospital Course:  Admitted to  critical care service. Neurosurgery consulted.   Subdural hematoma: Seen by neurosurgery. Nonoperative management recommended. Repeat CAT scan confirmed stability. Follow-up with Dr. Tressie Stalker in 2 weeks. Has worked with physical therapy occupational therapy and would benefit from skilled nursing facility.   Frequent falls:  Likely multifactorial. Was on multiple antihypertensives, diarrhetic's and sedating medications prior to admission. Gabapentin has been decreased to 600 mg nightly from 900 mg nightly. Diarrhetic's have been stopped. Corgard has been stopped. ARB has been stopped. Blood pressure remains normal off these medications and she has no signs of fluid overload or edema. Tramadol has been stopped. Echo without significant valvular disease. Ejection fraction normal. Mild pulmonary hypertension noted. Orthostatics checked during hospitalization (though not an admission). Negative for change. carotid doppler without significant stenosis. B12 not low , folate pending, ammonia slightly high. See below   Essential hypertension:  See above. Multiple antihypertensives have been stopped and she remains normotensive.  May need to resume if needed.   Hypothyroidism: TSH ok  Hyperammonemia: Mild. No known h/o liver ds and not on meds that would cause this. Ultrasound of the abdomen showed fatty liver but no definite evidence of cirrhosis. Have started daily lactulose. Would repeat within a week and titrate if needed.   Scalp laceration:  Repaired in the emergency room.  Insomnia: back on gabapentin, but lower dose   Procedures:  Scalp Laceration repair  Consultations:  Neurosurgery, critical care medicine  Discharge Exam: Filed Vitals:   08/21/14 0552  BP: 124/76  Pulse: 91  Temp: 97.7 F (36.5 C)  Resp: 18    General: Alert, oriented. Working with physical therapy walking with walker  Cardiovascular: Regular rate rhythm without murmurs gallops or  rubs Respiratory: Clear to auscultation bilaterally without wheezes rhonchi rales  Discharge Instructions   Discharge Instructions    Diet - low sodium heart healthy    Complete by:  As directed      Discharge to SNF when bed available    Complete by:  As directed      Walk with assistance    Complete by:  As directed           Current Discharge Medication List    START taking these medications   Details  amLODipine (NORVASC) 5 MG tablet Take 1 tablet (5 mg total) by mouth daily.    lactulose (CHRONULAC) 10 GM/15ML solution Take 45 mLs (30 g total) by mouth daily. Qty: 240 mL, Refills: 0      CONTINUE these medications which have CHANGED   Details  acetaminophen (TYLENOL) 325 MG tablet Take 2 tablets (650 mg total) by mouth 3 (three) times daily.    gabapentin (NEURONTIN) 300 MG capsule Take 2 capsules (600 mg total) by mouth at bedtime.      CONTINUE these medications which have NOT CHANGED   Details  Calcium-Vitamin D-Vitamin K (VIACTIV) 500-500-40 MG-UNT-MCG CHEW Chew 1 tablet by mouth daily.    Cranberry-Vitamin C-Vitamin E (CRANBERRY PLUS VITAMIN C) 4200-20-3 MG-MG-UNIT CAPS Take 1 capsule by mouth daily.    docusate sodium (COLACE) 100 MG capsule Take 100 mg by mouth daily as needed for mild constipation.    escitalopram (LEXAPRO) 5 MG tablet Take 5 mg by mouth daily.     Fluticasone-Salmeterol (ADVAIR) 250-50 MCG/DOSE AEPB Inhale 1 puff into the lungs 2 (two) times daily.    levothyroxine (SYNTHROID, LEVOTHROID) 25 MCG tablet Take 25 mcg by mouth daily before breakfast. 6:45am    loratadine (CLARITIN) 10 MG tablet Take 10 mg by mouth daily as needed for allergies.    meclizine (ANTIVERT) 25 MG tablet Take by mouth 2 (two) times daily as needed for dizziness (giddiness).     metaxalone (SKELAXIN) 800 MG tablet Take 400-800 mg by mouth See admin instructions. Take 1/2 - 1 tablet (400-800 mg) at onset of headache, may repeat in 6-8 hours if needed     mometasone (NASONEX) 50 MCG/ACT nasal spray Place 2 sprays into both nostrils daily.     Multiple Vitamin (MULTIVITAMIN WITH MINERALS) TABS tablet Take 1 tablet by mouth daily. Centrum Silver Ultra    omeprazole (PRILOSEC) 40 MG capsule Take 40 mg by mouth daily before lunch.     ranitidine (ZANTAC) 300 MG tablet Take 300 mg by mouth at bedtime.     rizatriptan (MAXALT) 10 MG tablet Take 10 mg by mouth daily as needed for migraine. May repeat in 2 hours if needed, max 3 tablets in 24 hours      STOP taking these medications     acetaminophen (TYLENOL ARTHRITIS PAIN) 650 MG CR tablet      amLODipine-olmesartan (AZOR) 5-40 MG per tablet      fluconazole (DIFLUCAN) 100 MG tablet      furosemide (LASIX) 20 MG tablet      nadolol (CORGARD) 20 MG tablet      nystatin (MYCOSTATIN) powder      prochlorperazine (COMPAZINE) 5 MG tablet      traMADol (ULTRAM) 50 MG tablet      triamterene-hydrochlorothiazide (MAXZIDE-25) 37.5-25 MG per tablet        Allergies  Allergen Reactions  . Aspirin Other (See Comments)  Hx of bleeding ulcer  . Codeine Rash  . Erythromycin Base Rash  . Hydrocodone Rash  . Macrobid [Nitrofurantoin Monohyd Macro] Rash  . Maxidone [Hydrocodone-Acetaminophen] Rash    rash  . Nitrofurantoin Rash   Follow-up Information    Follow up with Cristi Loron, MD.   Specialty:  Neurosurgery   Why:  Please call and make appointment to see Dr. Lovell Sheehan in 2 weeks   Contact information:   [address] [place] [address] [phone]        The results of significant diagnostics from this hospitalization (including imaging, microbiology, ancillary and laboratory) are listed below for reference.    Significant Diagnostic Studies: Ct Head Wo Contrast  08/18/2014   CLINICAL DATA:  Followup right subdural hematoma  EXAM: CT HEAD WITHOUT CONTRAST  TECHNIQUE: Contiguous axial images were obtained from the base of the skull through the  vertex without intravenous contrast.  COMPARISON:  08/17/2014  FINDINGS: Bony calvarium is intact. Diffuse mucosal thickening is noted within the maxillary antra bilaterally stable from the previous exam.  There is again noted a right-sided subdural hematoma. Maximum thickness is approximately 6 mm. There remains minimal midline shift of approximately 3 mm. No significant increase in degree of hemorrhage is noted. Mild atrophic changes are seen. No findings to suggest acute infarct are noted.  IMPRESSION: Stable right subdural hematoma with minimal midline shift from right to left.   Electronically Signed   By: Alcide Clever M.D.   On: 08/18/2014 07:43   Ct Head Wo Contrast  08/17/2014   CLINICAL DATA:  Status post fall. Small laceration to the left occipital area.  EXAM: CT HEAD WITHOUT CONTRAST  TECHNIQUE: Contiguous axial images were obtained from the base of the skull through the vertex without intravenous contrast.  COMPARISON:  07/17/2014  FINDINGS: There is no evidence of mass effect or midline shift. There is no evidence of a space-occupying lesion. There is hyperdense fluid along the right cerebral convexity measuring 6 mm in thickness consistent with a subdural hemorrhage. There is 3 mm of right to left midline shift. There is no evidence of a cortical-based area of acute infarction. There is generalized cerebral atrophy. There is periventricular white matter low attenuation likely secondary to microangiopathy.  The ventricles and sulci are appropriate for the patient's age. The basal cisterns are patent.  Visualized portions of the orbits are unremarkable. The visualized portions of the paranasal sinuses and mastoid air cells are unremarkable. Cerebrovascular atherosclerotic calcifications are noted.  The osseous structures are unremarkable.  IMPRESSION: 1. Acute right subdural hematoma measuring 6 mm with 3 mm of right-to-left midline shift. Critical Value/emergent results were called by telephone at  the time of interpretation on 08/17/2014 at 3:03 pm to Dr. Arby Barrette , who verbally acknowledged these results.   Electronically Signed   By: Elige Ko   On: 08/17/2014 15:09   US Abdomen Complete  08/20/2014   CLINICAL DATA:  Elevated pneumonia level  EXAM: ULTRASOUND ABDOMEN COMPLETE  COMPARISON:  None.  FINDINGS: Gallbladder: Not visualized which corresponds with the patient's given clinical history of cholecystectomy.  Common bile duct: Diameter: 6.7 mm.  Liver: Slight increased echogenicity consistent with fatty infiltration.  IVC: No abnormality visualized.  Pancreas: Visualized portion unremarkable.  Spleen: Size and appearance within normal limits.  Right Kidney: Length: 10.5 cm. Echogenicity within normal limits. No mass or hydronephrosis visualized.  Left Kidney: Length: 10.3 cm. Echogenicity within normal limits. No mass or hydronephrosis visualized.  Abdominal aorta: No aneurysm visualized.  Other findings: None.  IMPRESSION: Fatty liver.   Electronically Signed   By: Alcide Clever M.D.   On: 08/20/2014 15:39   Dg Chest Port 1 View  08/17/2014   CLINICAL DATA:  81 year old female with hypoxemia. Difficulty catching her breath.  EXAM: PORTABLE CHEST - 1 VIEW  COMPARISON:  Chest such May 7 08/2014.  FINDINGS: Lung volumes are normal. No consolidative airspace disease. No pleural effusions. Mild diffuse peribronchial cuffing, most notable in the left mid lung. No evidence of pulmonary edema. Heart size is borderline enlarged. The patient is rotated to the left on today's exam, resulting in distortion of the mediastinal contours and reduced diagnostic sensitivity and specificity for mediastinal pathology. Atherosclerosis and tortuosity of the thoracic aorta.  IMPRESSION: 1. Mild diffuse peribronchial cuffing may suggest bronchitis. 2. Atherosclerosis.   Electronically Signed   By: Trudie Reed M.D.   On: 08/17/2014 18:24    Microbiology: Recent Results (from the past 240 hour(s))  MRSA  PCR Screening     Status: None   Collection Time: 08/17/14  7:00 PM  Result Value Ref Range Status   MRSA by PCR NEGATIVE NEGATIVE Final    Comment:        The GeneXpert MRSA Assay (FDA approved for NASAL specimens only), is one component of a comprehensive MRSA colonization surveillance program. It is not intended to diagnose MRSA infection nor to guide or monitor treatment for MRSA infections.      Labs: Basic Metabolic Panel:  Recent Labs Lab 08/17/14 1508 08/17/14 1825 08/18/14 0205 08/19/14 1448  NA 133* 132* 134* 129*  K 4.4 4.5 4.4 3.9  CL 94* 95* 97* 95*  CO2 28 29 29 24   GLUCOSE 146* 91 101* 135*  BUN 34* 28* 24* 17  CREATININE 1.46* 1.24* 1.01* 0.85  CALCIUM 9.4 9.2 9.2 9.3  MG  --  1.8 1.7  --   PHOS  --  3.6 3.2  --    Liver Function Tests:  Recent Labs Lab 08/17/14 1825  AST 25  ALT 21  ALKPHOS 89  BILITOT 0.3  PROT 6.4*  ALBUMIN 3.6   No results for input(s): LIPASE, AMYLASE in the last 168 hours.  Recent Labs Lab 08/19/14 1448  AMMONIA 63*   CBC:  Recent Labs Lab 08/17/14 1508 08/17/14 1825 08/18/14 0205  WBC 9.2 7.8 8.0  NEUTROABS 7.0 5.5  --   HGB 12.3 12.0 11.2*  HCT 38.2 36.9 34.7*  MCV 84.9 83.3 83.4  PLT 223 221 201   Cardiac Enzymes: No results for input(s): CKTOTAL, CKMB, CKMBINDEX, TROPONINI in the last 168 hours. BNP: BNP (last 3 results) No results for input(s): BNP in the last 8760 hours.  ProBNP (last 3 results) No results for input(s): PROBNP in the last 8760 hours.  CBG: No results for input(s): GLUCAP in the last 168 hours.     SignedChristiane Ha  [facility] 08/21/2014, 9:49 AM

## 2014-08-21 NOTE — Clinical Social Work Note (Signed)
CSW met pt and the pt's daughter Jade Rodriguez at bedside. CSW introduce self and purpose of visit. CSW informed the pt that she will be discharge back to [facility] today. CSW and pt discussed transport. Pt reported that Jade Rodriguez will take her. CSW upload the pt's discharge summary. Bedside RN can call reported to [phone].   [place], MSW, [place]

## 2014-08-21 NOTE — Progress Notes (Signed)
Physical Therapy Treatment Patient Details Name: Jade Rodriguez MRN: [id number] DOB: 12-12-1923 Today's Date: 08/21/2014    History of Present Illness 81 year old female who resides at [facility] fell while transferring to [facility]. Struck head with now LOC. CT scan shows subdural hematoma.     PT Comments    Continues to make progress towards physical therapy goals. Focused on correcting Rt trendelenburg with gait training, balance exercises, and safety with mobility. Remains appropriate for SNF to improve safety and independence prior to returning to ALF.  Follow Up Recommendations  SNF     Equipment Recommendations  None recommended by PT    Recommendations for Other Services       Precautions / Restrictions Precautions Precautions: Fall Restrictions Weight Bearing Restrictions: No    Mobility  Bed Mobility               General bed mobility comments: in recliner  Transfers Overall transfer level: Needs assistance Equipment used: 1 person hand held assist Transfers: Sit to/from Stand Sit to Stand: Min assist         General transfer comment: Min assist for balance without assistive device to stablize. VC for hand placement. Good control with descent into chair.  Ambulation/Gait Ambulation/Gait assistance: Min assist Ambulation Distance (Feet): 65 Feet Assistive device: Rolling walker (2 wheeled);1 person hand held assist Gait Pattern/deviations: Step-through pattern;Decreased step length - left;Decreased stance time - right;Trendelenburg;Antalgic;Drifts right/left Gait velocity: Decreased   General Gait Details: Short distance without an an assistive device requiring min assist for balance with severe Rt trendelenburg. Improves with use of a rolling walker for support, uses UEs heavily. Tactile and Max verbal cues for Rt glute activation in Rt stance phase for activation and symmetrical gait mechanics. Very fatigued towards end of bout. HR to  122.   Stairs            Wheelchair Mobility    Modified Rankin (Stroke Patients Only)       Balance                 Single Leg Stance - Right Leg:  (less thand 15 seconds due to increase in Rt hip pain) Single Leg Stance - Left Leg: 0.5 (BIL UE support with Rt and Lt single leg stance.) Tandem Stance - Right Leg: 0.25 Tandem Stance - Left Leg: 0.15 Rhomberg - Eyes Opened: 0.5 Rhomberg - Eyes Closed: 0.25        Cognition Arousal/Alertness: Awake/alert Behavior During Therapy: WFL for tasks assessed/performed Overall Cognitive Status: Within Functional Limits for tasks assessed                      Exercises General Exercises - Lower Extremity Ankle Circles/Pumps: AROM;Both;10 reps;Seated Gluteal Sets: Strengthening;Both;10 reps;Seated Long Arc Quad: Strengthening;Both;10 reps;Seated    General Comments General comments (skin integrity, edema, etc.): HR 110 at rest, to 122 ambulating.      Pertinent Vitals/Pain Pain Assessment: No/denies pain Pain Intervention(s): Monitored during session    Home Living                      Prior Function            PT Goals (current goals can now be found in the care plan section) Acute Rehab PT Goals Patient Stated Goal: return to [facility] with therapy services PT Goal Formulation: With patient/family Time For Goal Achievement: 09/02/14 Potential to Achieve Goals: Good Progress towards PT  goals: Progressing toward goals    Frequency  Min 3X/week    PT Plan Current plan remains appropriate    Co-evaluation             End of Session Equipment Utilized During Treatment: Gait belt Activity Tolerance: Patient tolerated treatment well Patient left: in chair;with call bell/phone within reach;with chair alarm set     Time: [id number] PT Time Calculation (min) (ACUTE ONLY): 21 min  Charges:  $Gait Training: 8-22 mins                    G Codes:      [place]erton [place], Jailin Manocchio  S 08/21/2014, 10:24 AM Charlsie MerlesLogan Secor Erendira Crabtree, PT [phone]

## 2014-08-21 NOTE — Progress Notes (Signed)
Patient is being d/c to a skilled nursing home. Report called to the receiving nurse Misty StanleyLisa. Patient's daughter will transport.

## 2015-02-19 ENCOUNTER — Other Ambulatory Visit: Payer: Self-pay | Admitting: Orthopedic Surgery

## 2015-03-02 ENCOUNTER — Inpatient Hospital Stay (HOSPITAL_COMMUNITY)
Admission: RE | Admit: 2015-03-02 | Discharge: 2015-03-02 | Disposition: A | Discharge status: Home / Self Care | Payer: Medicare Other | Source: Ambulatory Visit

## 2015-03-02 NOTE — Pre-Procedure Instructions (Addendum)
Jade Rodriguez  03/02/2015     No Pharmacies Listed   Your procedure is scheduled on January 27th, Friday   Report to [facility] Admitting at 8:30 Am             (Surgery time 10:33 - 11:18 AM)   Call this number if you have problems the mo rning of surgery:  [phone]   Remember:  Do not eat food or drink liquids after midnight Thursday.   Take these medicines the morning of surgery with A SIP OF WATER :Lexapro, Omeprazole, Tramadol.  Please use Advair & Flonase that morning.              (Please STOP taking any herbal medications, vitamins, or blood thinners 4-5 days prior to surgery)   Do not wear jewelry, make-up or nail polish.  Do not wear lotions, powders, or perfumes.  You may NOT wear deodorant the day of surgery.  Do not shave underarms & legs 48 hours prior to surgery.     Do not bring valuables to the hospital.  [facility] is not responsible for any belongings or valuables.  Contacts, dentures or bridgework may not be worn into surgery.  Leave your suitcase in the car.  After surgery it may be brought to your room. For patients admitted to the hospital, discharge time will be determined by your treatment team.  Patients discharged the day of surgery will not be allowed to drive home.   Name and phone number of your driver:                                                                                                    Please read over the following fact sheets that you were given. Pain Booklet and Surgical Site Infection Prevention

## 2015-03-05 ENCOUNTER — Inpatient Hospital Stay (HOSPITAL_COMMUNITY): Admission: RE | Admit: 2015-03-05 | Payer: Medicare Other | Source: Ambulatory Visit | Admitting: Orthopedic Surgery

## 2015-03-05 ENCOUNTER — Encounter (HOSPITAL_COMMUNITY): Admission: RE | Payer: Self-pay | Source: Ambulatory Visit

## 2015-03-05 SURGERY — OPEN REDUCTION INTERNAL FIXATION (ORIF) ELBOW/OLECRANON FRACTURE
Anesthesia: Choice | Laterality: Right

## 2016-08-25 IMAGING — US US ABDOMEN COMPLETE
1 series · 14 of 25 positions shown · non-contrast
Comparison: None.

CLINICAL DATA: Elevated pneumonia level

EXAM:
ULTRASOUND ABDOMEN COMPLETE

[Series 1: us abdomen complete · 0.13mm/px · 14 of 43 slices shown]
[im 1/43]
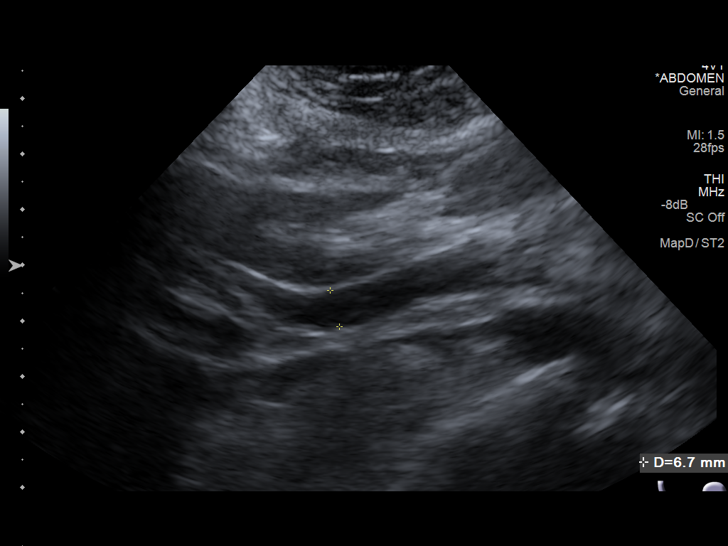
[im 4/43]
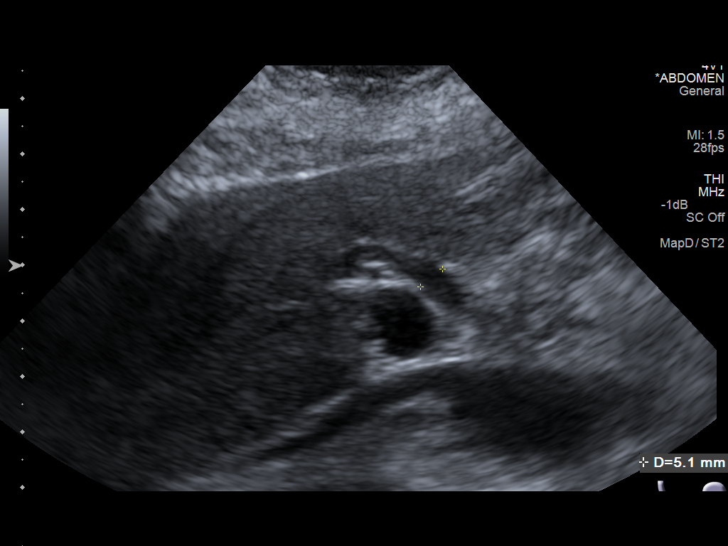
[im 8/43]
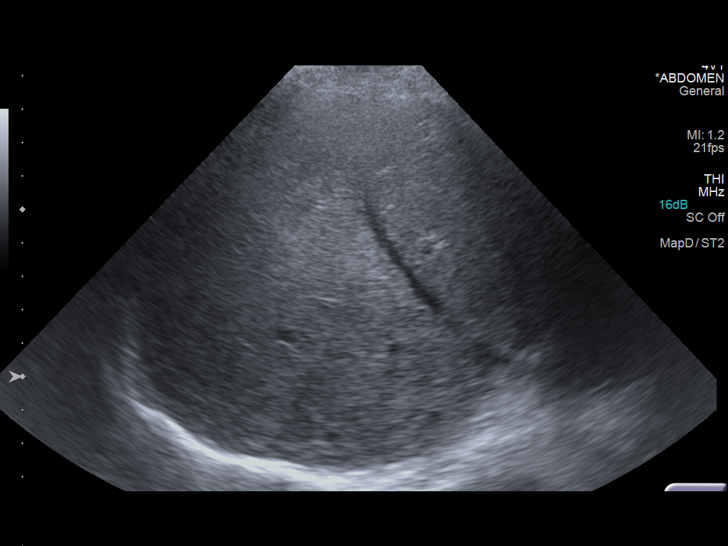
[im 11/43]
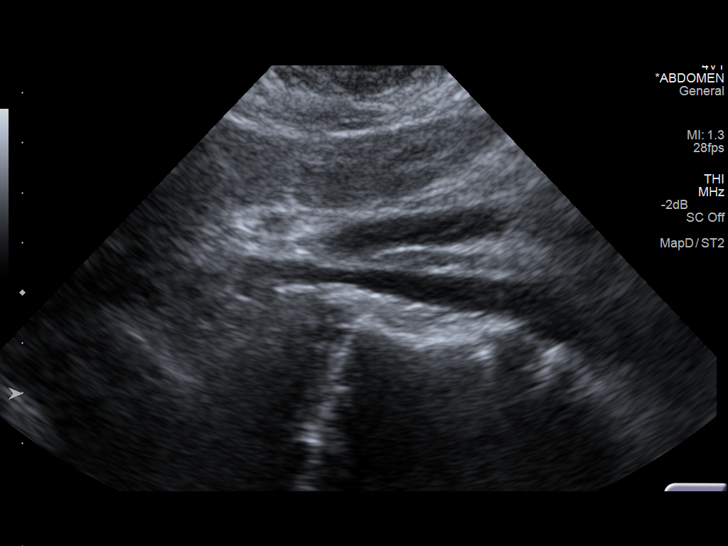
[im 15/43]
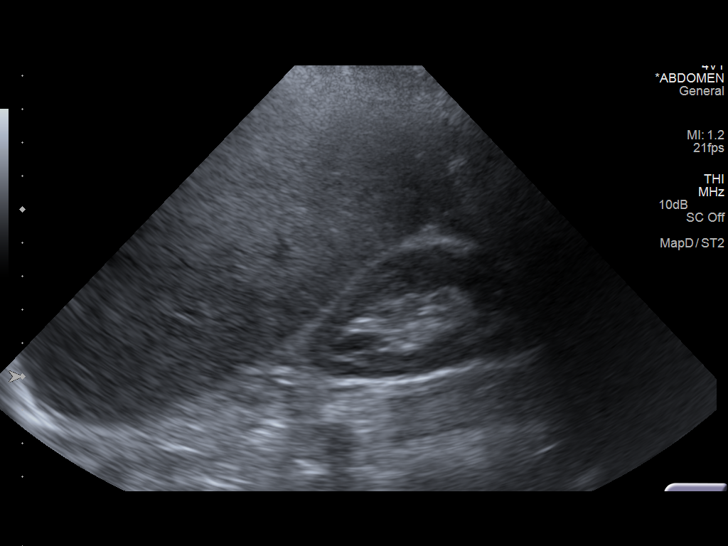
[im 16/43]
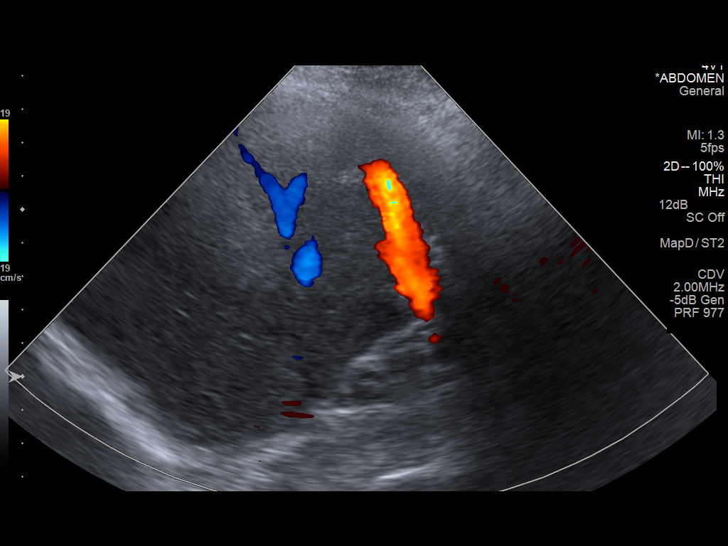
[im 20/43]
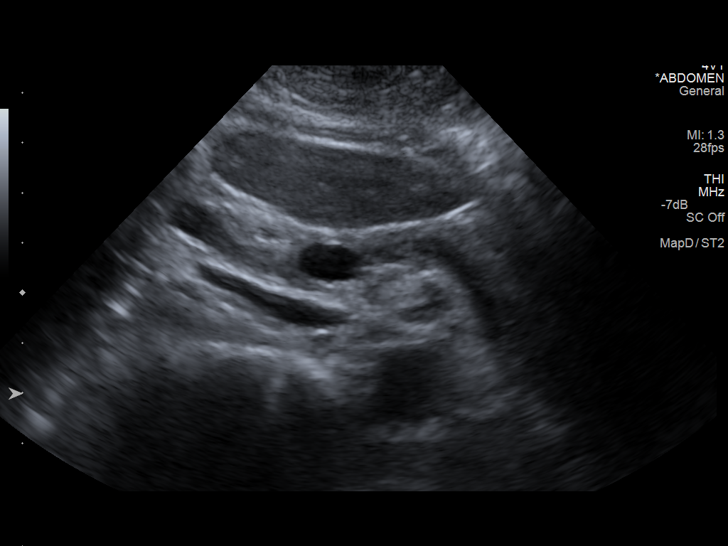
[im 23/43]
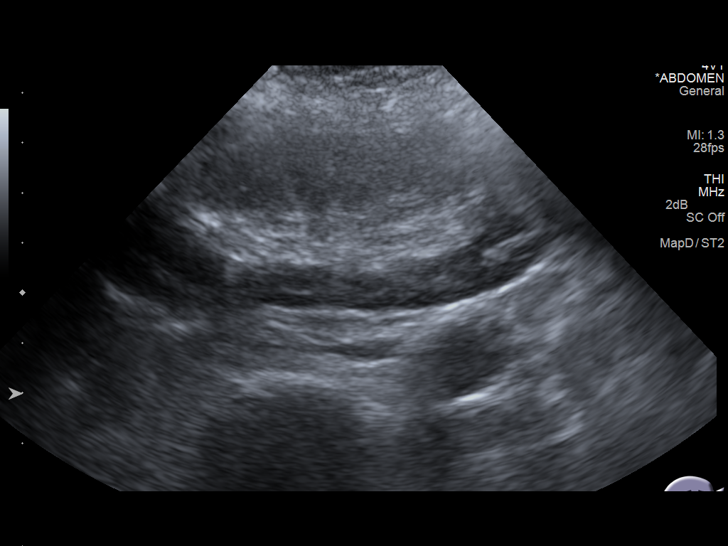
[im 27/43]
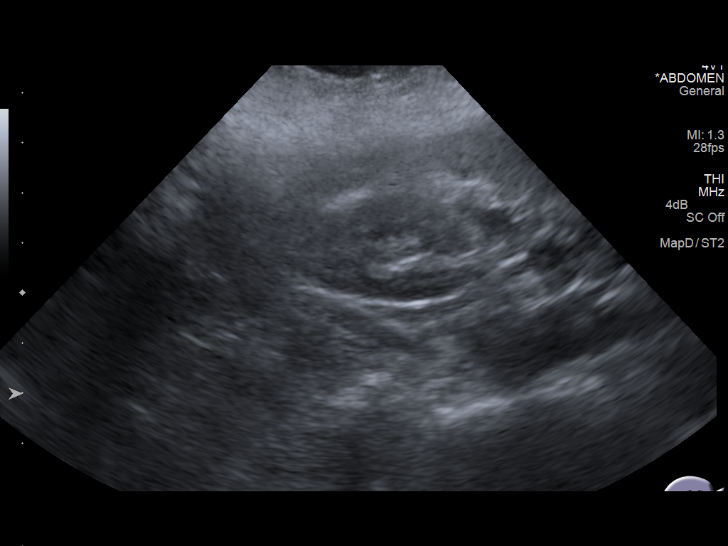
[im 29/43]
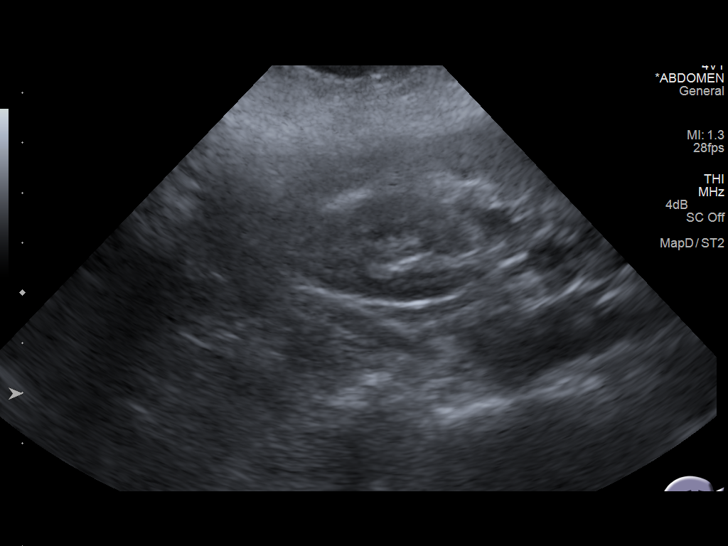
[im 32/43]
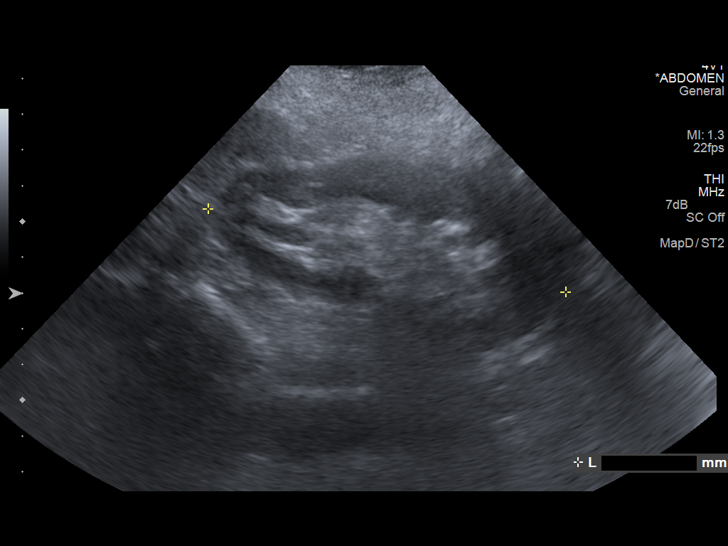
[im 36/43]
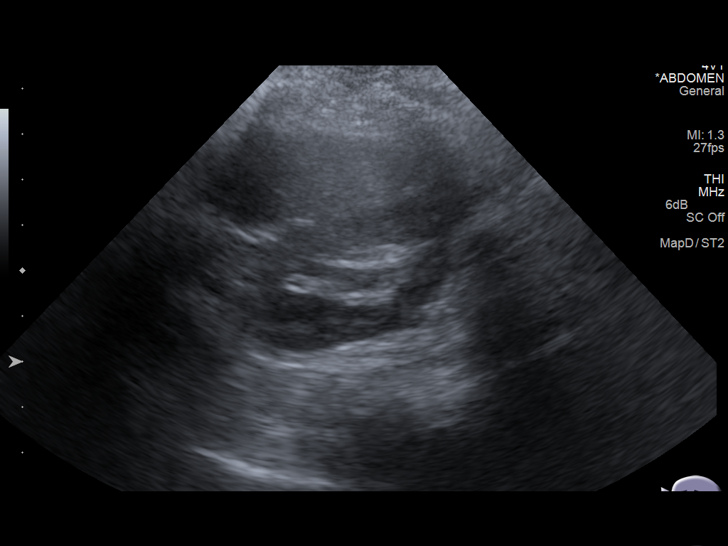
[im 39/43]
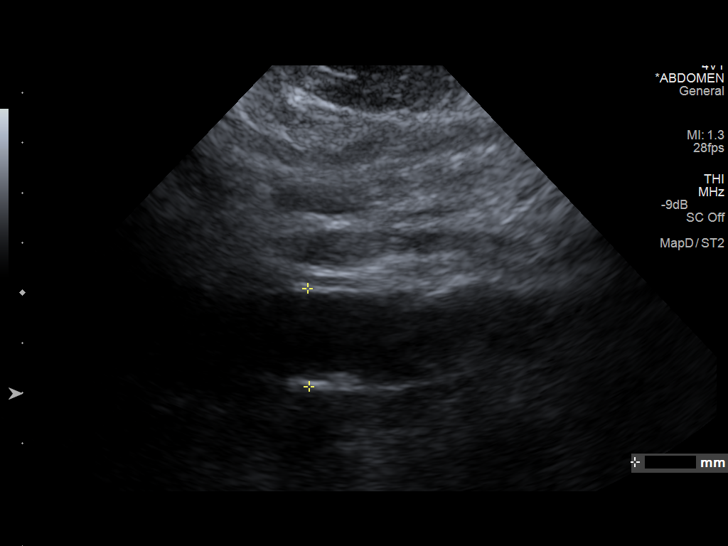
[im 43/43]
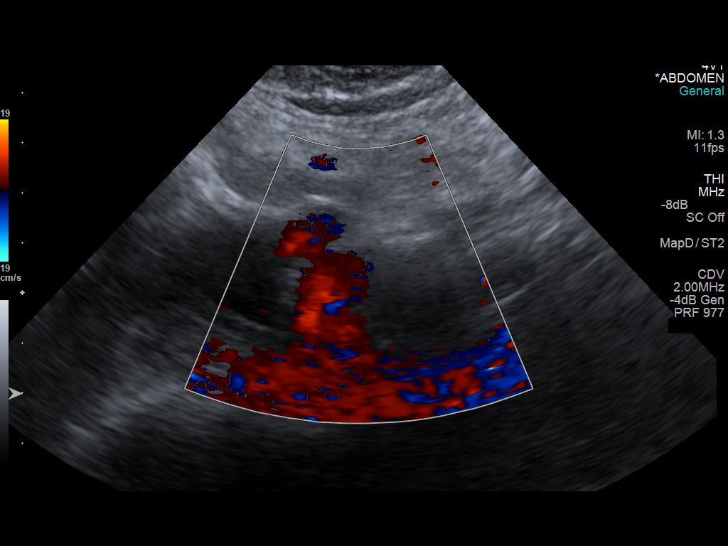

[14 of 25 positions shown; findings below may reference images not displayed]

FINDINGS: Gallbladder: Not visualized which corresponds with the patient's
given clinical history of cholecystectomy.

Common bile duct: Diameter: 6.7 mm.

Liver: Slight increased echogenicity consistent with fatty
infiltration.

IVC: No abnormality visualized.

Pancreas: Visualized portion unremarkable.

Spleen: Size and appearance within normal limits.

Right Kidney: Length: 10.5 cm.. Echogenicity within normal limits.
No mass or hydronephrosis visualized.

Left Kidney: Length: 10.3 cm.. Echogenicity within normal limits. No
mass or hydronephrosis visualized.

Abdominal aorta: No aneurysm visualized.

Other findings: None.
IMPRESSION: Fatty liver.

## 2017-02-13 IMAGING — CT CT HEAD W/O CM
1 series · 16 of 30 positions shown, 20 images · non-contrast
Comparison: 08/17/2014

CLINICAL DATA: Followup right subdural hematoma

EXAM:
CT HEAD WITHOUT CONTRAST
TECHNIQUE: Contiguous axial images were obtained from the base of the skull
through the vertex without intravenous contrast.

[Series 2: head 5.0 h30s · axial · 0.44mm/px · z∈[-121,+14]mm · 16 of 31 slices shown, 20 images]
[im 2/31  brain]
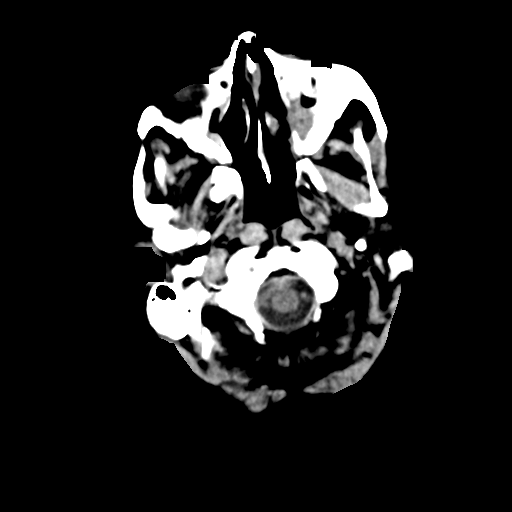
[im 2/31  bone]
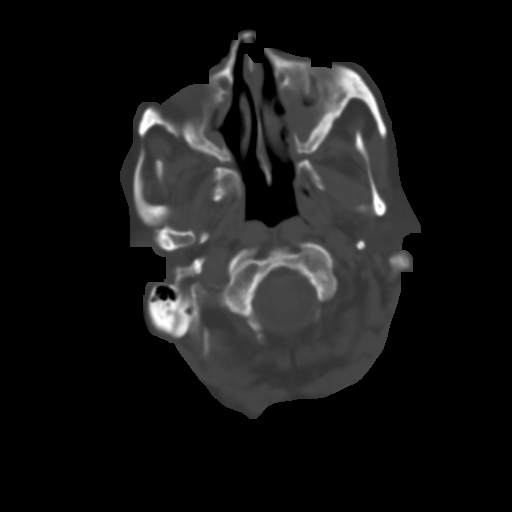
[im 4/31  brain]
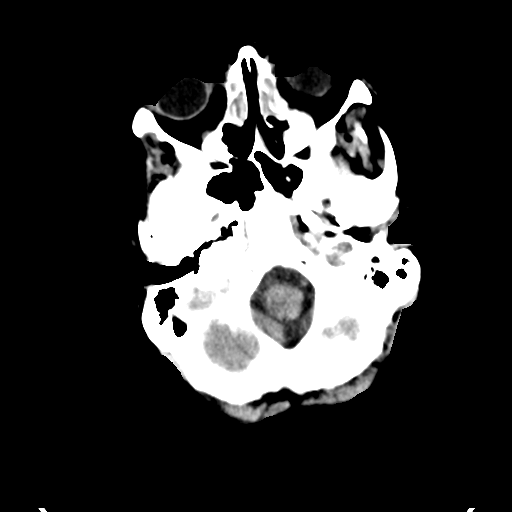
[im 6/31  brain]
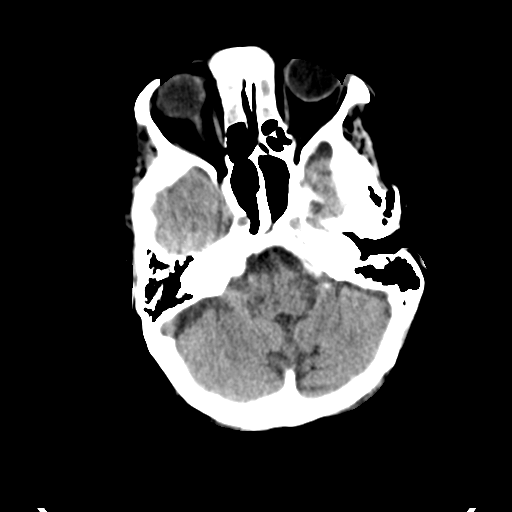
[im 8/31  brain]
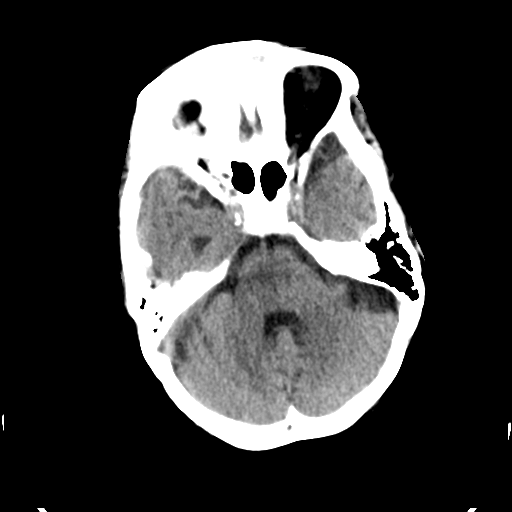
[im 9/31  brain]
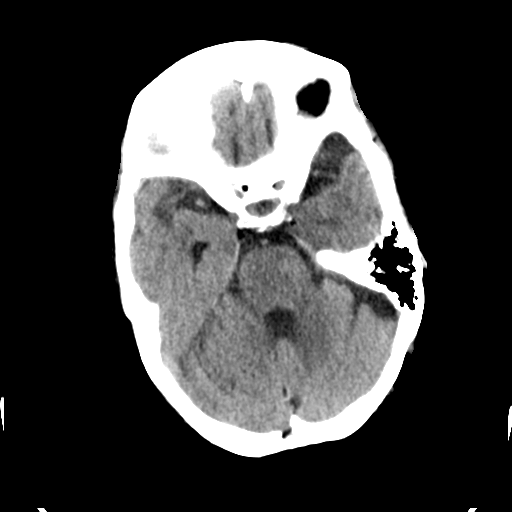
[im 9/31  bone]
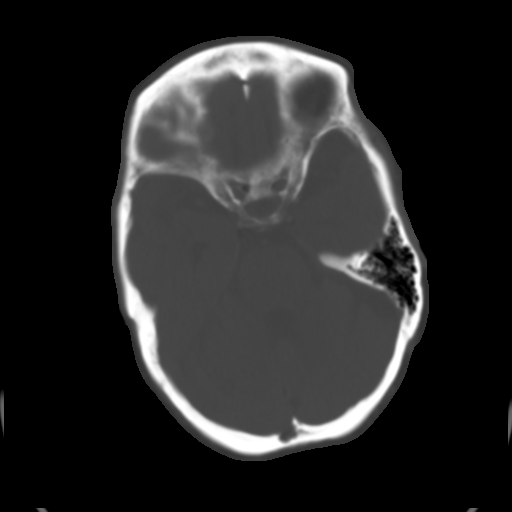
[im 11/31  brain]
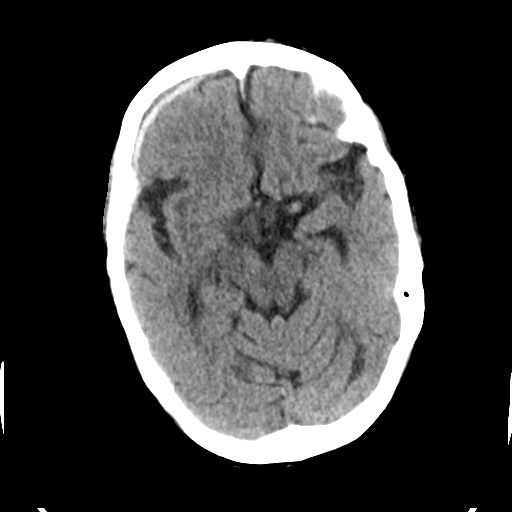
[im 13/31  brain]
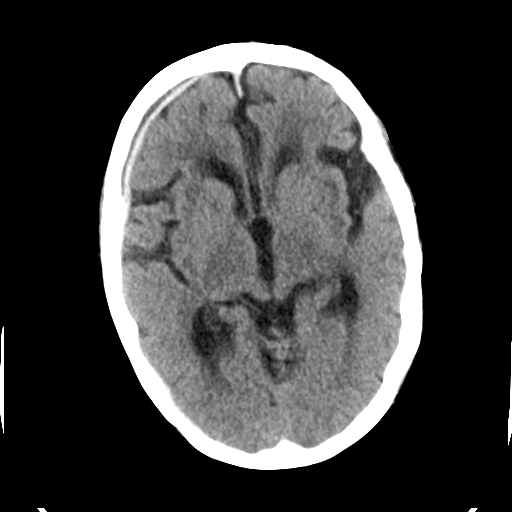
[im 15/31  brain]
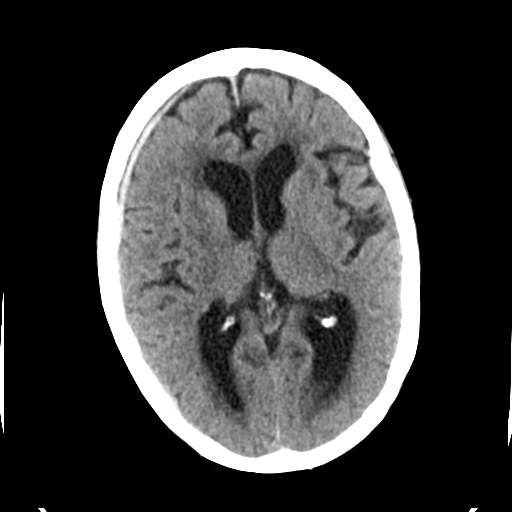
[im 16/31  brain]
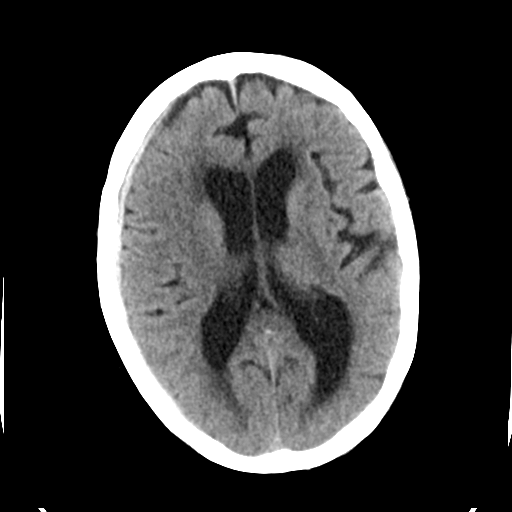
[im 16/31  bone]
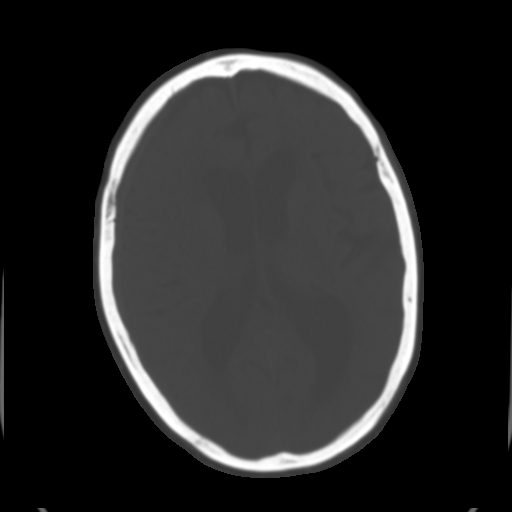
[im 18/31  brain]
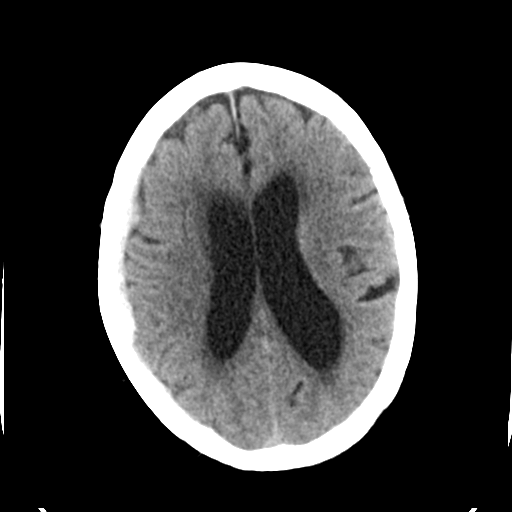
[im 20/31  brain]
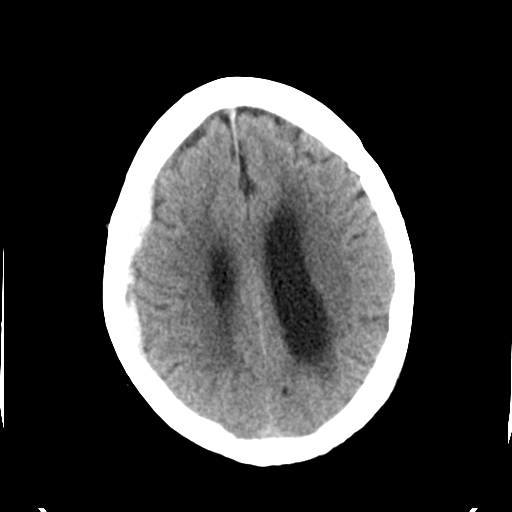
[im 22/31  brain]
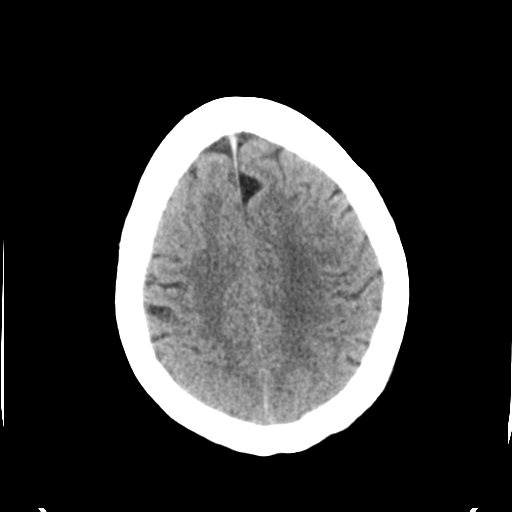
[im 23/31  brain]
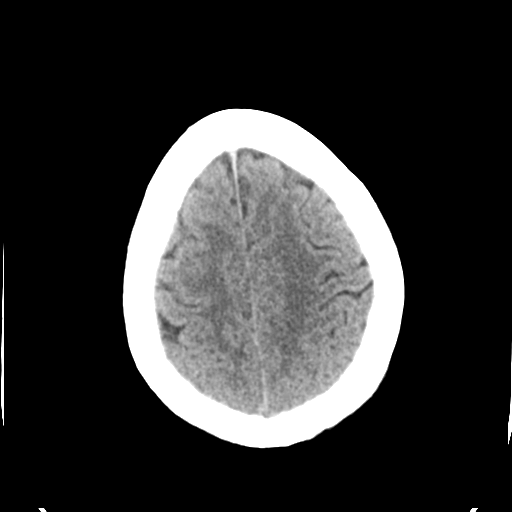
[im 23/31  bone]
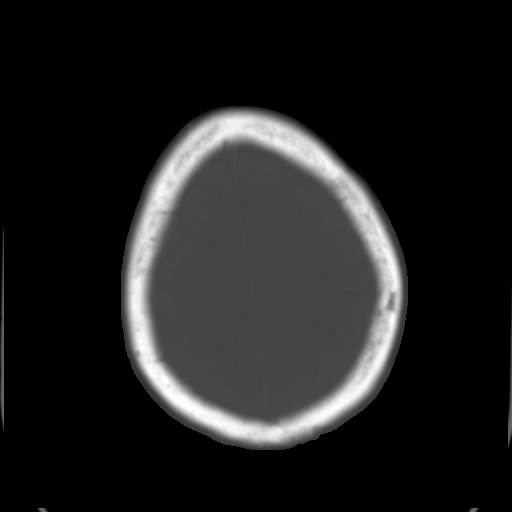
[im 25/31  brain]
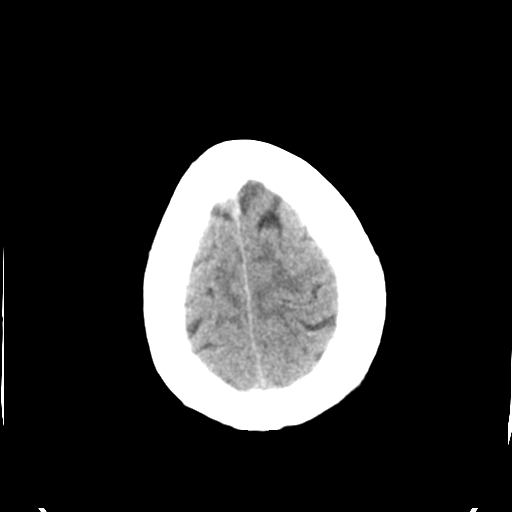
[im 27/31  brain]
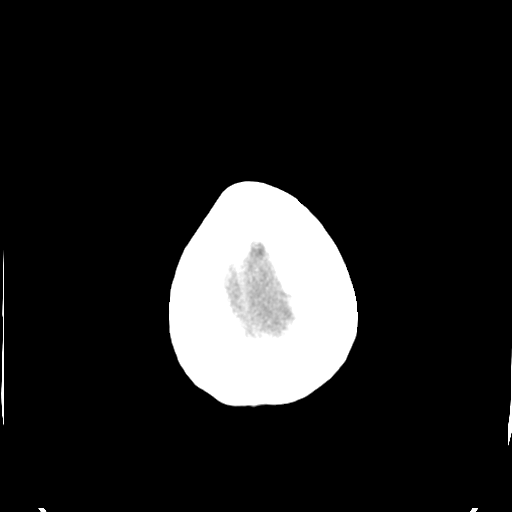
[im 29/31  brain]
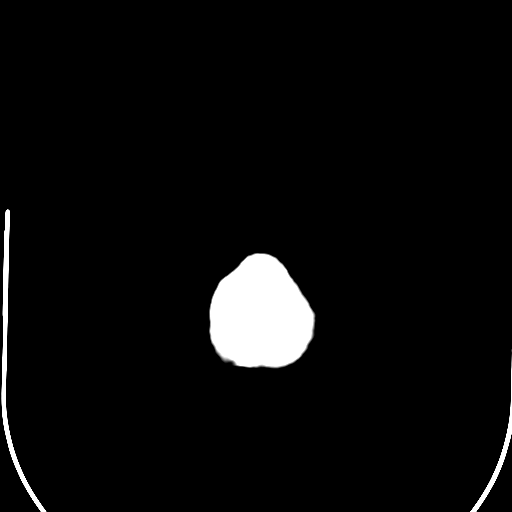

[16 of 30 positions shown; findings below may reference images not displayed]

FINDINGS: Bony calvarium is intact. Diffuse mucosal thickening is noted within
the maxillary antra bilaterally stable from the previous exam.

There is again noted a right-sided subdural hematoma. Maximum
thickness is approximately 6 mm. There remains minimal midline shift
of approximately 3 mm. No significant increase in degree of
hemorrhage is noted. Mild atrophic changes are seen. No findings to
suggest acute infarct are noted.
IMPRESSION: Stable right subdural hematoma with minimal midline shift from right
to left.

## 2017-07-07 DEATH — deceased
